# Patient Record
Sex: Male | Born: 1950 | Race: White | Hispanic: No | Smoking: Former smoker
Health system: Southern US, Community
[De-identification: ages and names within clinical notes are randomized; demographics above are authoritative.]

## PROBLEM LIST (undated history)

## (undated) DIAGNOSIS — C801 Malignant (primary) neoplasm, unspecified: Secondary | ICD-10-CM

## (undated) DIAGNOSIS — E785 Hyperlipidemia, unspecified: Secondary | ICD-10-CM

## (undated) DIAGNOSIS — Z86010 Personal history of colonic polyps: Secondary | ICD-10-CM

## (undated) HISTORY — DX: Hyperlipidemia, unspecified: E78.5

## (undated) HISTORY — PX: TONSILLECTOMY: SUR1361

## (undated) HISTORY — PX: COLONOSCOPY: SHX174

## (undated) HISTORY — DX: Personal history of colonic polyps: Z86.010

---
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
## 1983-12-04 DIAGNOSIS — C801 Malignant (primary) neoplasm, unspecified: Secondary | ICD-10-CM

## 1983-12-04 HISTORY — DX: Malignant (primary) neoplasm, unspecified: C80.1

## 2006-07-25 ENCOUNTER — Ambulatory Visit: Payer: Self-pay | Admitting: General Surgery

## 2009-01-19 ENCOUNTER — Emergency Department: Payer: Self-pay | Admitting: Unknown Physician Specialty

## 2009-01-19 ENCOUNTER — Ambulatory Visit: Payer: Self-pay | Admitting: Family Medicine

## 2010-12-03 DIAGNOSIS — Z860100 Personal history of colon polyps, unspecified: Secondary | ICD-10-CM

## 2010-12-03 HISTORY — DX: Personal history of colonic polyps: Z86.010

## 2010-12-03 HISTORY — DX: Personal history of colon polyps, unspecified: Z86.0100

## 2011-10-05 ENCOUNTER — Ambulatory Visit: Payer: Self-pay | Admitting: General Surgery

## 2013-06-17 ENCOUNTER — Encounter: Payer: Self-pay | Admitting: *Deleted

## 2016-08-30 ENCOUNTER — Encounter: Payer: Self-pay | Admitting: *Deleted

## 2016-10-08 ENCOUNTER — Ambulatory Visit: Payer: Self-pay | Admitting: General Surgery

## 2017-05-16 ENCOUNTER — Encounter: Payer: Self-pay | Admitting: *Deleted

## 2017-05-22 ENCOUNTER — Encounter: Payer: Self-pay | Admitting: General Surgery

## 2017-05-22 ENCOUNTER — Ambulatory Visit: Payer: Self-pay | Admitting: General Surgery

## 2017-05-22 ENCOUNTER — Ambulatory Visit (INDEPENDENT_AMBULATORY_CARE_PROVIDER_SITE_OTHER): Payer: BLUE CROSS/BLUE SHIELD | Admitting: General Surgery

## 2017-05-22 VITALS — BP 130/80 | HR 74 | Resp 12 | Ht 69.0 in | Wt 223.0 lb

## 2017-05-22 DIAGNOSIS — Z86010 Personal history of colonic polyps: Secondary | ICD-10-CM

## 2017-05-22 MED ORDER — POLYETHYLENE GLYCOL 3350 17 GM/SCOOP PO POWD: ORAL | 0 refills | Status: DC

## 2017-05-22 NOTE — Progress Notes (Signed)
Patient ID: Eduardo Chapman. [name]., male   DOB: 10-27-1951, 66 y.o.   MRN: [id number]  Chief Complaint  Patient presents with  . Colonoscopy    HPI Eduardo Chapman. Eduardo Chapman. is a 66 y.o. male  Here today for a evaluation of a colonoscopy. Last colonoscopy was done on 10/05/2011. No GI problems at this time.  HPI  Past Medical History:  Diagnosis Date  . Hyperlipidemia   . Personal history of colonic polyps 2012    Past Surgical History:  Procedure Laterality Date  . COLONOSCOPY  [id number]   path report 2012, benign    No family history on file.  Social History Social History  Substance Use Topics  . Smoking status: Never Smoker  . Smokeless tobacco: Never Used  . Alcohol use Yes     Comment: ocasionally    No Known Allergies  Current Outpatient Prescriptions  Medication Sig Dispense Refill  . diphenhydrAMINE (BENADRYL) 50 MG tablet Take 50 mg by mouth at bedtime as needed for itching.    . Multiple Vitamins-Minerals (MULTIVITAMIN WITH MINERALS) tablet Take 1 tablet by mouth daily.    . rosuvastatin (CRESTOR) 5 MG tablet Take 5 mg by mouth daily.    . polyethylene glycol powder (GLYCOLAX/MIRALAX) powder 255 grams one bottle for colonoscopy prep 255 Chapman 0   No current facility-administered medications for this visit.     Review of Systems Review of Systems  Constitutional: Negative.   Respiratory: Negative.   Cardiovascular: Negative.   Gastrointestinal: Negative.     Blood pressure 130/80, pulse 74, resp. rate 12, height 5\' 9"  (1.753 m), weight 223 lb (101.2 kg).  Physical Exam Physical Exam  Constitutional: He is oriented to person, place, and time. He appears well-developed and well-nourished.  Eyes: Conjunctivae are normal. No scleral icterus.  Neck: Neck supple.  Cardiovascular: Normal rate, regular rhythm and normal heart sounds.   Pulmonary/Chest: Effort normal and breath sounds normal.  Abdominal: Soft. Normal appearance and bowel sounds are normal. There is  no hepatomegaly. No hernia.  Lymphadenopathy:    He has no cervical adenopathy.  Neurological: He is alert and oriented to person, place, and time.  Skin: Skin is warm and dry.  Psychiatric: He has a normal mood and affect. His behavior is normal.    Data Reviewed Prior notes and colonoscopy reviewed.  Assessment    History of colonic polyp- removed over 10 years ago. Pt is asymptomatic, no complaints at this time. Stable exam otherwise.    Plan    Colonoscopy with possible biopsy/polypectomy prn: Information regarding the procedure, including its potential risks and complications (including but not limited to perforation of the bowel, which may require emergency surgery to repair, and bleeding) was verbally given to the patient. Educational information regarding lower intestinal endoscopy was given to the patient. Written instructions for how to complete the bowel prep using Miralax were provided. The importance of drinking ample fluids to avoid dehydration as a result of the prep emphasized.     HPI, Physical Exam, Assessment and Plan have been scribed under the direction and in the presence of Mckinley Jewel, MD  Eduardo Chapman, CMA  I have completed the exam and reviewed the above documentation for accuracy and completeness.  I agree with the above.  Haematologist has been used and any errors in dictation or transcription are unintentional.  Eduardo Chapman. Jamal Collin, M.D., F.A.C.S.  Eduardo Chapman 05/22/2017, 12:57 PM  Patient has been scheduled for a colonoscopy on 06-12-17  at [facility]. Miralax prescription has been sent in to the patient's pharmacy today. Colonoscopy instructions have been reviewed with the patient. This patient is aware to call the office if they have further questions.   Dominga Ferry, CMA

## 2017-05-22 NOTE — Patient Instructions (Signed)
Colonoscopy, Adult A colonoscopy is an exam to look at the entire large intestine. During the exam, a lubricated, bendable tube is inserted into the anus and then passed into the rectum, colon, and other parts of the large intestine. A colonoscopy is often done as a part of normal colorectal screening or in response to certain symptoms, such as anemia, persistent diarrhea, abdominal pain, and blood in the stool. The exam can help screen for and diagnose medical problems, including:  Tumors.  Polyps.  Inflammation.  Areas of bleeding.  Tell a health care provider about:  Any allergies you have.  All medicines you are taking, including vitamins, herbs, eye drops, creams, and over-the-counter medicines.  Any problems you or family members have had with anesthetic medicines.  Any blood disorders you have.  Any surgeries you have had.  Any medical conditions you have.  Any problems you have had passing stool. What are the risks? Generally, this is a safe procedure. However, problems may occur, including:  Bleeding.  A tear in the intestine.  A reaction to medicines given during the exam.  Infection (rare).  What happens before the procedure? Eating and drinking restrictions Follow instructions from your health care provider about eating and drinking, which may include:  A few days before the procedure - follow a low-fiber diet. Avoid nuts, seeds, dried fruit, raw fruits, and vegetables.  1-3 days before the procedure - follow a clear liquid diet. Drink only clear liquids, such as clear broth or bouillon, black coffee or tea, clear juice, clear soft drinks or sports drinks, gelatin dessert, and popsicles. Avoid any liquids that contain red or purple dye.  On the day of the procedure - do not eat or drink anything during the 2 hours before the procedure, or within the time period that your health care provider recommends.  Bowel prep If you were prescribed an oral bowel prep  to clean out your colon:  Take it as told by your health care provider. Starting the day before your procedure, you will need to drink a large amount of medicated liquid. The liquid will cause you to have multiple loose stools until your stool is almost clear or light green.  If your skin or anus gets irritated from diarrhea, you may use these to relieve the irritation: ? Medicated wipes, such as adult wet wipes with aloe and vitamin E. ? A skin soothing-product like petroleum jelly.  If you vomit while drinking the bowel prep, take a break for up to 60 minutes and then begin the bowel prep again. If vomiting continues and you cannot take the bowel prep without vomiting, call your health care provider.  General instructions  Ask your health care provider about changing or stopping your regular medicines. This is especially important if you are taking diabetes medicines or blood thinners.  Plan to have someone take you home from the hospital or clinic. What happens during the procedure?  An IV tube may be inserted into one of your veins.  You will be given medicine to help you relax (sedative).  To reduce your risk of infection: ? Your health care team will wash or sanitize their hands. ? Your anal area will be washed with soap.  You will be asked to lie on your side with your knees bent.  Your health care provider will lubricate a long, thin, flexible tube. The tube will have a camera and a light on the end.  The tube will be inserted into your   anus.  The tube will be gently eased through your rectum and colon.  Air will be delivered into your colon to keep it open. You may feel some pressure or cramping.  The camera will be used to take images during the procedure.  A small tissue sample may be removed from your body to be examined under a microscope (biopsy). If any potential problems are found, the tissue will be sent to a lab for testing.  If small polyps are found, your  health care provider may remove them and have them checked for cancer cells.  The tube that was inserted into your anus will be slowly removed. The procedure may vary among health care providers and hospitals. What happens after the procedure?  Your blood pressure, heart rate, breathing rate, and blood oxygen level will be monitored until the medicines you were given have worn off.  Do not drive for 24 hours after the exam.  You may have a small amount of blood in your stool.  You may pass gas and have mild abdominal cramping or bloating due to the air that was used to inflate your colon during the exam.  It is up to you to get the results of your procedure. Ask your health care provider, or the department performing the procedure, when your results will be ready. This information is not intended to replace advice given to you by your health care provider. Make sure you discuss any questions you have with your health care provider. Document Released: 11/16/2000 Document Revised: 09/19/2016 Document Reviewed: 01/31/2016 Elsevier Interactive Patient Education  2018 Elsevier Inc.  

## 2017-06-11 ENCOUNTER — Encounter: Payer: Self-pay | Admitting: *Deleted

## 2017-06-12 ENCOUNTER — Ambulatory Visit
Admission: RE | Admit: 2017-06-12 | Discharge: 2017-06-12 | Disposition: A | Discharge status: Home / Self Care | Payer: BLUE CROSS/BLUE SHIELD | Source: Ambulatory Visit | Attending: General Surgery | Admitting: General Surgery

## 2017-06-12 ENCOUNTER — Ambulatory Visit: Payer: BLUE CROSS/BLUE SHIELD | Admitting: Anesthesiology

## 2017-06-12 ENCOUNTER — Encounter
Admission: RE | Disposition: A | Discharge status: Home / Self Care | Payer: Self-pay | Source: Ambulatory Visit | Attending: General Surgery

## 2017-06-12 DIAGNOSIS — K648 Other hemorrhoids: Secondary | ICD-10-CM | POA: Insufficient documentation

## 2017-06-12 DIAGNOSIS — Z86010 Personal history of colonic polyps: Secondary | ICD-10-CM | POA: Insufficient documentation

## 2017-06-12 DIAGNOSIS — K640 First degree hemorrhoids: Secondary | ICD-10-CM | POA: Diagnosis not present

## 2017-06-12 DIAGNOSIS — D127 Benign neoplasm of rectosigmoid junction: Secondary | ICD-10-CM | POA: Diagnosis not present

## 2017-06-12 DIAGNOSIS — E785 Hyperlipidemia, unspecified: Secondary | ICD-10-CM | POA: Diagnosis not present

## 2017-06-12 DIAGNOSIS — D123 Benign neoplasm of transverse colon: Secondary | ICD-10-CM | POA: Diagnosis not present

## 2017-06-12 DIAGNOSIS — K621 Rectal polyp: Secondary | ICD-10-CM | POA: Insufficient documentation

## 2017-06-12 DIAGNOSIS — Z1211 Encounter for screening for malignant neoplasm of colon: Secondary | ICD-10-CM | POA: Diagnosis present

## 2017-06-12 HISTORY — PX: COLONOSCOPY WITH PROPOFOL: SHX5780

## 2017-06-12 SURGERY — COLONOSCOPY WITH PROPOFOL
Anesthesia: General

## 2017-06-12 MED ORDER — PROPOFOL 500 MG/50ML IV EMUL
INTRAVENOUS | Status: AC
  Filled 2017-06-12: qty 50

## 2017-06-12 MED ORDER — MIDAZOLAM HCL 2 MG/2ML IJ SOLN
INTRAMUSCULAR | Status: DC | PRN
  Administered 2017-06-12: 2 mg via INTRAVENOUS

## 2017-06-12 MED ORDER — PROPOFOL 500 MG/50ML IV EMUL
INTRAVENOUS | Status: DC | PRN
  Administered 2017-06-12: 120 ug/kg/min via INTRAVENOUS

## 2017-06-12 MED ORDER — FENTANYL CITRATE (PF) 100 MCG/2ML IJ SOLN
INTRAMUSCULAR | Status: AC
Start: 2017-06-12 — End: ?
  Filled 2017-06-12: qty 2

## 2017-06-12 MED ORDER — EPHEDRINE SULFATE 50 MG/ML IJ SOLN
INTRAMUSCULAR | Status: AC
  Filled 2017-06-12: qty 1

## 2017-06-12 MED ORDER — SODIUM CHLORIDE 0.9 % IV SOLN
INTRAVENOUS | Status: DC
  Administered 2017-06-12: 08:00:00 via INTRAVENOUS

## 2017-06-12 MED ORDER — FENTANYL CITRATE (PF) 100 MCG/2ML IJ SOLN
INTRAMUSCULAR | Status: DC | PRN
  Administered 2017-06-12 (×2): 50 ug via INTRAVENOUS

## 2017-06-12 MED ORDER — MIDAZOLAM HCL 2 MG/2ML IJ SOLN
INTRAMUSCULAR | Status: AC
  Filled 2017-06-12: qty 2

## 2017-06-12 MED ORDER — EPHEDRINE SULFATE 50 MG/ML IJ SOLN
INTRAMUSCULAR | Status: DC | PRN
  Administered 2017-06-12: 10 mg via INTRAVENOUS
  Administered 2017-06-12: 5 mg via INTRAVENOUS
  Administered 2017-06-12: 10 mg via INTRAVENOUS

## 2017-06-12 NOTE — Anesthesia Preprocedure Evaluation (Signed)
Anesthesia Evaluation  Patient identified by MRN, date of birth, ID band Patient awake    Reviewed: Allergy & Precautions, H&P , NPO status , Patient's Chart, lab work & pertinent test results  History of Anesthesia Complications Negative for: history of anesthetic complications  Airway Mallampati: III  TM Distance: >3 FB Neck ROM: limited    Dental  (+) Poor Dentition, Chipped, Caps   Pulmonary neg pulmonary ROS, neg shortness of breath,           Cardiovascular Exercise Tolerance: Good (-) angina(-) Past MI and (-) DOE negative cardio ROS       Neuro/Psych negative neurological ROS  negative psych ROS   GI/Hepatic negative GI ROS, Neg liver ROS, neg GERD  ,  Endo/Other  negative endocrine ROS  Renal/GU negative Renal ROS  negative genitourinary   Musculoskeletal   Abdominal   Peds  Hematology negative hematology ROS (+)   Anesthesia Other Findings Past Medical History: No date: Hyperlipidemia 2012: Personal history of colonic polyps  Past Surgical History: 2002,2012: COLONOSCOPY     Comment: path report 2012, benign  BMI    Body Mass Index:  31.01 kg/m      Reproductive/Obstetrics negative OB ROS                             Anesthesia Physical Anesthesia Plan  ASA: II  Anesthesia Plan: General   Post-op Pain Management:    Induction: Intravenous  PONV Risk Score and Plan:   Airway Management Planned: Natural Airway and Nasal Cannula  Additional Equipment:   Intra-op Plan:   Post-operative Plan:   Informed Consent: I have reviewed the patients History and Physical, chart, labs and discussed the procedure including the risks, benefits and alternatives for the proposed anesthesia with the patient or authorized representative who has indicated his/her understanding and acceptance.   Dental Advisory Given  Plan Discussed with: Anesthesiologist, CRNA and  Surgeon  Anesthesia Plan Comments: (Patient consented for risks of anesthesia including but not limited to:  - adverse reactions to medications - damage to teeth, lips or other oral mucosa - sore throat or hoarseness - Damage to heart, brain, lungs or loss of life  Patient voiced understanding.)        Anesthesia Quick Evaluation

## 2017-06-12 NOTE — Op Note (Signed)
[facility]astroenterology Patient Name: Taivon Haroon Procedure Date: 06/12/2017 8:49 AM MRN: [id number] Account #: [id number] Date of Birth: 1951-06-08 Admit Type: Outpatient Age: 66 Room: [facility] ENDO ROOM 1 Gender: Male Note Status: Finalized Procedure:            Colonoscopy Indications:          High risk colon cancer surveillance: Personal history                        of colonic polyps Providers:            Kiano Terrien G. Jamal Collin, MD Referring MD:         Leona Carry. Hall Busing, MD (Referring MD) Medicines:            Total IV Anesthesia (TIVA) Complications:        No immediate complications. Procedure:            Pre-Anesthesia Assessment:                       - Using IV propofol under the supervision of an                        anesthesiologist was determined to be medically                        necessary for this procedure based on review of the                        patient's medical history, medications, and prior                        anesthesia history.                       After obtaining informed consent, the colonoscope was                        passed under direct vision. Throughout the procedure,                        the patient's blood pressure, pulse, and oxygen                        saturations were monitored continuously. The                        Colonoscope was introduced through the anus and                        advanced to the the cecum, identified by the ileocecal                        valve. The colonoscopy was performed without                        difficulty. The patient tolerated the procedure well.                        The quality of the bowel preparation was good. Findings:      The perianal and digital rectal examinations were normal.  A 5 mm polyp was found in the recto-sigmoid colon. The polyp was       sessile. The polyp was removed with a cold snare. Resection and       retrieval were complete.      Two  sessile polyps were found in the rectum. The polyps were 3 mm in       size. These polyps were removed with a cold snare. Resection and       retrieval were complete.      Three sessile polyps were found in the rectum (benign-appearing lesion).       The polyps were 2 mm in size. These polyps were removed with a cold       biopsy forceps. Resection and retrieval were complete.      Internal hemorrhoids were found during retroflexion. The hemorrhoids       were mild and Grade I (internal hemorrhoids that do not prolapse).      The exam was otherwise without abnormality on direct and retroflexion       views. Impression:           - One 5 mm polyp at the recto-sigmoid colon, removed                        with a cold snare. Resected and retrieved.                       - Two 3 mm polyps in the rectum, removed with a cold                        snare. Resected and retrieved.                       - Three benign appearing 2 mm polyps in the rectum,                        removed with a cold biopsy forceps. Resected and                        retrieved.                       - Internal hemorrhoids.                       - The examination was otherwise normal on direct and                        retroflexion views. Recommendation:       - Discharge patient to home.                       - Resume previous diet.                       - Continue present medications.                       - Await pathology results.                       - Repeat colonoscopy in 5 years for surveillance. Procedure Code(s):    --- Professional ---  45385, Colonoscopy, flexible; with removal of tumor(s),                        polyp(s), or other lesion(s) by snare technique                       45380, 59, Colonoscopy, flexible; with biopsy, single                        or multiple Diagnosis Code(s):    --- Professional ---                       Z86.010, Personal history of colonic polyps                        D12.7, Benign neoplasm of rectosigmoid junction                       K62.1, Rectal polyp                       K64.0, First degree hemorrhoids CPT copyright 2016 American Medical Association. All rights reserved. The codes documented in this report are preliminary and upon coder review may  be revised to meet current compliance requirements. Christene Lye, MD 06/12/2017 9:52:10 AM This report has been signed electronically. Number of Addenda: 0 Note Initiated On: 06/12/2017 8:49 AM Scope Withdrawal Time: 0 hours 17 minutes 58 seconds  Total Procedure Duration: 0 hours 42 minutes 9 seconds       [facility]

## 2017-06-12 NOTE — Anesthesia Procedure Notes (Signed)
Performed by: Vaughan Sine Pre-anesthesia Checklist: Patient identified, Emergency Drugs available, Suction available, Patient being monitored and Timeout performed Patient Re-evaluated:Patient Re-evaluated prior to inductionOxygen Delivery Method: Nasal cannula Preoxygenation: Pre-oxygenation with 100% oxygen Intubation Type: IV induction Airway Equipment and Method: Oral airway Placement Confirmation: positive ETCO2 and CO2 detector

## 2017-06-12 NOTE — Transfer of Care (Signed)
Immediate Anesthesia Transfer of Care Note  Patient: Eduardo Schreier. Fearnow Sr.  Procedure(s) Performed: Procedure(s): COLONOSCOPY WITH PROPOFOL (N/A)  Patient Location: PACU  Anesthesia Type:General  Level of Consciousness: awake and sedated  Airway & Oxygen Therapy: Patient Spontanous Breathing  Post-op Assessment: Report given to RN and Post -op Vital signs reviewed and stable  Post vital signs: Reviewed and stable  Last Vitals:  Vitals:   06/12/17 0812  BP: 111/82  Pulse: 86  Resp: 18  Temp: (!) 36.2 C    Last Pain:  Vitals:   06/12/17 0812  TempSrc: Tympanic         Complications: No apparent anesthesia complications

## 2017-06-12 NOTE — H&P (View-Only) (Signed)
Patient ID: Eduardo Chapman. Eduardo Chapman., male   DOB: 02-03-1951, 66 y.o.   MRN: [id number]  Chief Complaint  Patient presents with  . Colonoscopy    HPI Eduardo Chapman. Eduardo Chapman. is a 66 y.o. male  Here today for a evaluation of a colonoscopy. Last colonoscopy was done on 10/05/2011. No GI problems at this time.  HPI  Past Medical History:  Diagnosis Date  . Hyperlipidemia   . Personal history of colonic polyps 2012    Past Surgical History:  Procedure Laterality Date  . COLONOSCOPY  [id number]   path report 2012, benign    No family history on file.  Social History Social History  Substance Use Topics  . Smoking status: Never Smoker  . Smokeless tobacco: Never Used  . Alcohol use Yes     Comment: ocasionally    No Known Allergies  Current Outpatient Prescriptions  Medication Sig Dispense Refill  . diphenhydrAMINE (BENADRYL) 50 MG tablet Take 50 mg by mouth at bedtime as needed for itching.    . Multiple Vitamins-Minerals (MULTIVITAMIN WITH MINERALS) tablet Take 1 tablet by mouth daily.    . rosuvastatin (CRESTOR) 5 MG tablet Take 5 mg by mouth daily.    . polyethylene glycol powder (GLYCOLAX/MIRALAX) powder 255 grams one bottle for colonoscopy prep 255 g 0   No current facility-administered medications for this visit.     Review of Systems Review of Systems  Constitutional: Negative.   Respiratory: Negative.   Cardiovascular: Negative.   Gastrointestinal: Negative.     Blood pressure 130/80, pulse 74, resp. rate 12, height 5\' 9"  (1.753 m), weight 223 lb (101.2 kg).  Physical Exam Physical Exam  Constitutional: He is oriented to person, place, and time. He appears well-developed and well-nourished.  Eyes: Conjunctivae are normal. No scleral icterus.  Neck: Neck supple.  Cardiovascular: Normal rate, regular rhythm and normal heart sounds.   Pulmonary/Chest: Effort normal and breath sounds normal.  Abdominal: Soft. Normal appearance and bowel sounds are normal. There is  no hepatomegaly. No hernia.  Lymphadenopathy:    He has no cervical adenopathy.  Neurological: He is alert and oriented to person, place, and time.  Skin: Skin is warm and dry.  Psychiatric: He has a normal mood and affect. His behavior is normal.    Data Reviewed Prior notes and colonoscopy reviewed.  Assessment    History of colonic polyp- removed over 10 years ago. Pt is asymptomatic, no complaints at this time. Stable exam otherwise.    Plan    Colonoscopy with possible biopsy/polypectomy prn: Information regarding the procedure, including its potential risks and complications (including but not limited to perforation of the bowel, which may require emergency surgery to repair, and bleeding) was verbally given to the patient. Educational information regarding lower intestinal endoscopy was given to the patient. Written instructions for how to complete the bowel prep using Miralax were provided. The importance of drinking ample fluids to avoid dehydration as a result of the prep emphasized.     HPI, Physical Exam, Assessment and Plan have been scribed under the direction and in the presence of Mckinley Jewel, MD  Gaspar Cola, CMA  I have completed the exam and reviewed the above documentation for accuracy and completeness.  I agree with the above.  Haematologist has been used and any errors in dictation or transcription are unintentional.  Seeplaputhur G. Jamal Collin, M.D., F.A.C.S.  Junie Panning G 05/22/2017, 12:57 PM  Patient has been scheduled for a colonoscopy on 06-12-17  at [facility]. Miralax prescription has been sent in to the patient's pharmacy today. Colonoscopy instructions have been reviewed with the patient. This patient is aware to call the office if they have further questions.   Dominga Ferry, CMA

## 2017-06-12 NOTE — Interval H&P Note (Signed)
History and Physical Interval Note:  06/12/2017 8:44 AM  Eduardo Reas. Nistler Sr.  has presented today for surgery, with the diagnosis of HX COLON POLYP  The various methods of treatment have been discussed with the patient and family. After consideration of risks, benefits and other options for treatment, the patient has consented to  Procedure(s): COLONOSCOPY WITH PROPOFOL (N/A) as a surgical intervention .  The patient's history has been reviewed, patient examined, no change in status, stable for surgery.  I have reviewed the patient's chart and labs.  Questions were answered to the patient's satisfaction.     [name]

## 2017-06-12 NOTE — Anesthesia Post-op Follow-up Note (Cosign Needed)
Anesthesia QCDR form completed.        

## 2017-06-12 NOTE — Anesthesia Postprocedure Evaluation (Signed)
Anesthesia Post Note  Patient: Eduardo Haris. Giangrande Sr.  Procedure(s) Performed: Procedure(s) (LRB): COLONOSCOPY WITH PROPOFOL (N/A)  Patient location during evaluation: Endoscopy Anesthesia Type: General Level of consciousness: awake and alert Pain management: pain level controlled Vital Signs Assessment: post-procedure vital signs reviewed and stable Respiratory status: spontaneous breathing, nonlabored ventilation, respiratory function stable and patient connected to nasal cannula oxygen Cardiovascular status: blood pressure returned to baseline and stable Postop Assessment: no signs of nausea or vomiting Anesthetic complications: no     Last Vitals:  Vitals:   06/12/17 0812 06/12/17 0950  BP: 111/82 98/64  Pulse: 86 (!) 108  Resp: 18 15  Temp: (!) 36.2 C 36.4 C    Last Pain:  Vitals:   06/12/17 0950  TempSrc: Axillary                 Precious Haws Piscitello

## 2017-06-13 ENCOUNTER — Encounter: Payer: Self-pay | Admitting: General Surgery

## 2017-06-13 LAB — SURGICAL PATHOLOGY

## 2017-06-14 ENCOUNTER — Telehealth: Payer: Self-pay

## 2017-06-14 NOTE — Telephone Encounter (Signed)
Message left for patient to call for results. 

## 2017-06-14 NOTE — Telephone Encounter (Signed)
-----   Message from Christene Lye, MD sent at 06/14/2017  9:42 AM EDT ----- Please inform pt- polyps were benign

## 2017-06-17 NOTE — Telephone Encounter (Signed)
Notified patient as instructed, patient pleased. Discussed follow-up appointments, patient agrees  

## 2017-07-18 ENCOUNTER — Encounter: Payer: Self-pay | Admitting: General Surgery

## 2017-07-18 ENCOUNTER — Encounter: Payer: Self-pay | Admitting: *Deleted

## 2017-09-12 ENCOUNTER — Encounter: Payer: Self-pay | Admitting: General Surgery

## 2019-01-26 ENCOUNTER — Other Ambulatory Visit: Payer: Self-pay | Admitting: Physician Assistant

## 2019-01-26 DIAGNOSIS — M5416 Radiculopathy, lumbar region: Secondary | ICD-10-CM

## 2019-01-26 DIAGNOSIS — M5441 Lumbago with sciatica, right side: Secondary | ICD-10-CM

## 2019-02-06 ENCOUNTER — Ambulatory Visit
Admission: RE | Admit: 2019-02-06 | Discharge: 2019-02-06 | Disposition: A | Discharge status: Home / Self Care | Payer: BLUE CROSS/BLUE SHIELD | Source: Ambulatory Visit | Attending: Physician Assistant | Admitting: Physician Assistant

## 2019-02-06 ENCOUNTER — Other Ambulatory Visit: Payer: Self-pay

## 2019-02-06 DIAGNOSIS — M5441 Lumbago with sciatica, right side: Secondary | ICD-10-CM | POA: Diagnosis not present

## 2019-02-06 DIAGNOSIS — M5416 Radiculopathy, lumbar region: Secondary | ICD-10-CM | POA: Diagnosis present

## 2019-05-29 ENCOUNTER — Other Ambulatory Visit: Payer: Self-pay | Admitting: Neurosurgery

## 2019-06-01 NOTE — Addendum Note (Signed)
Addended by: Deetta Perla on: 06/01/2019 12:23 PM   Modules accepted: Orders

## 2019-06-15 ENCOUNTER — Encounter
Admission: RE | Admit: 2019-06-15 | Discharge: 2019-06-15 | Disposition: A | Discharge status: Home / Self Care | Payer: BC Managed Care – PPO | Source: Ambulatory Visit | Attending: Neurosurgery | Admitting: Neurosurgery

## 2019-06-15 ENCOUNTER — Other Ambulatory Visit: Payer: Self-pay

## 2019-06-15 DIAGNOSIS — M5416 Radiculopathy, lumbar region: Secondary | ICD-10-CM | POA: Insufficient documentation

## 2019-06-15 DIAGNOSIS — Z01818 Encounter for other preprocedural examination: Secondary | ICD-10-CM | POA: Diagnosis not present

## 2019-06-15 DIAGNOSIS — Z1159 Encounter for screening for other viral diseases: Secondary | ICD-10-CM | POA: Diagnosis not present

## 2019-06-15 LAB — BASIC METABOLIC PANEL
Anion gap: 11 (ref 5–15)
BUN: 14 mg/dL (ref 8–23)
CO2: 23 mmol/L (ref 22–32)
Calcium: 9.5 mg/dL (ref 8.9–10.3)
Chloride: 105 mmol/L (ref 98–111)
Creatinine, Ser: 0.61 mg/dL (ref 0.61–1.24)
GFR calc Af Amer: >60 mL/min (ref >60)
GFR calc non Af Amer: >60 mL/min (ref >60)
Glucose, Bld: 116 mg/dL — ABNORMAL HIGH (ref 70–99)
Potassium: 3.8 mmol/L (ref 3.5–5.1)
Sodium: 139 mmol/L (ref 135–145)

## 2019-06-15 LAB — CBC
HCT: 41.7 % (ref 39.0–52.0)
Hemoglobin: 13.9 g/dL (ref 13.0–17.0)
MCH: 31 pg (ref 26.0–34.0)
MCHC: 33.3 g/dL (ref 30.0–36.0)
MCV: 93.1 fL (ref 80.0–100.0)
Platelets: 233 10*3/uL (ref 150–400)
RBC: 4.48 MIL/uL (ref 4.22–5.81)
RDW: 12.4 % (ref 11.5–15.5)
WBC: 5.3 10*3/uL (ref 4.0–10.5)
nRBC: 0 % (ref 0.0–0.2)

## 2019-06-15 LAB — URINALYSIS, ROUTINE W REFLEX MICROSCOPIC
Bilirubin Urine: NEGATIVE
Glucose, UA: NEGATIVE mg/dL
Hgb urine dipstick: NEGATIVE
Ketones, ur: NEGATIVE mg/dL
Leukocytes,Ua: NEGATIVE
Nitrite: NEGATIVE
Protein, ur: NEGATIVE mg/dL
Specific Gravity, Urine: 1.013 (ref 1.005–1.030)
pH: 5 (ref 5.0–8.0)

## 2019-06-15 LAB — SURGICAL PCR SCREEN
MRSA, PCR: NEGATIVE
Staphylococcus aureus: NEGATIVE

## 2019-06-15 LAB — PROTIME-INR
INR: 0.9 (ref 0.8–1.2)
Prothrombin Time: 11.9 seconds (ref 11.4–15.2)

## 2019-06-15 LAB — APTT: aPTT: 29 seconds (ref 24–36)

## 2019-06-15 NOTE — Patient Instructions (Signed)
INSTRUCTIONS FOR SURGERY     Your surgery is scheduled for:   Monday, July 20TH     To find out your arrival time for the day of surgery,          please call [phone] between 1 pm and 3 pm on :  Friday, July 17TH     When you arrive for surgery, report to the [facility].       Do NOT stop on the first floor to register.    REMEMBER: Instructions that are not followed completely may result in serious medical risk,  up to and including death, or upon the discretion of your surgeon and anesthesiologist,            your surgery may need to be rescheduled.  __X__ 1. Do not eat food after midnight the night before your procedure.                    No gum, candy, lozenger, tic tacs, tums or hard candies.                  ABSOLUTELY NOTHING SOLID IN YOUR MOUTH AFTER MIDNIGHT                    You may drink unlimited clear liquids up to 2 hours before you are scheduled to arrive for surgery.                   Do not drink anything within those 2 hours unless you need to take medicine, then take the                   smallest amount you need.  Clear liquids include:  water, apple juice without pulp,                   any flavor Gatorade, Black coffee, black tea.  Sugar may be added but no dairy/ honey /lemon.                        Broth and jello is not considered a clear liquid.  __x__  2. On the morning of surgery, please brush your teeth with toothpaste and water. You may rinse with                  mouthwash if you wish but DO NOT SWALLOW TOOTHPASTE OR MOUTHWASH  __X___3. NO alcohol for 24 hours before or after surgery.  __x___ 4.  Do NOT smoke or use e-cigarettes for 24 HOURS PRIOR TO SURGERY.                      DO NOT Use any chewable tobacco products for at least 6 hours prior to surgery.  __x___ 5. If you start any new medication after this appointment and prior to surgery, please  Bring it with you on the day of surgery.  ___x__ 6. Notify your doctor if there is any change in your medical condition, such as fever, infection, vomitting,  Diarrhea or any open sores.  __x___ 7.  USE the CHG SOAP as instructed, the night before surgery and the day of surgery.                   Once you have washed with this soap, do NOT use any of the following: Powders, perfumes                    or lotions. Please do not wear make up, hairpins, clips or nail polish. You MAY wear deodorant.                   Men may shave their face and neck.  Women need to shave 48 hours prior to surgery.                   DO NOT wear ANY jewelry on the day of surgery. If there are rings that are too tight to                    remove easily, please address this prior to the surgery day. Piercings need to be removed.                                                                     NO METAL ON YOUR BODY.                    Do NOT bring any valuables.  If you came to Pre-Admit testing then you will not need license,                     insurance card or credit card.  If you will be staying overnight, please either leave your things in                     the car or have your family be responsible for these items.                     [facility] IS NOT RESPONSIBLE FOR BELONGINGS OR VALUABLES.  ___X__ 8. DO NOT wear contact lenses on surgery day.  You may not have dentures,                     Hearing aides, contacts or glasses in the operating room. These items can be                    Placed in the Recovery Room to receive immediately after surgery.  __x___ 9. IF YOU ARE SCHEDULED TO GO HOME ON THE SAME DAY, YOU MUST                   Have someone to drive you home and to stay with you  for the first 24 hours.                    Have an arrangement prior to arriving on surgery day.  ___x__ 10. Take the following medications on the morning of surgery with a sip of water:  1. TYLENOL , IF NEEDED                     2.                     3.                  _____ 11.  Follow any instructions provided to you by your surgeon.                        Such as enema, clear liquid bowel prep  __X__  12. STOP COUMADIN / PLAVIX / ELIQUIS / ASPIRIN AS OF: TODAY                       THIS INCLUDES BC POWDERS / GOODIES POWDER  __x___ 13. STOP Anti-inflammatories as of: TODAY                      This includes IBUPROFEN / MOTRIN / ADVIL / ALEVE/ NAPROXYN                    YOU MAY TAKE TYLENOL ANY TIME PRIOR TO SURGERY.  _X____ 14.  Stop supplements until after surgery.                     This includes: MULTIVITAMINS                 You may continue taking Vitamin B12 / Vitamin D3 but do not take on the morning of surgery.  _____   15. Bring your CPAP machine into preop with you on the morning of surgery.  __X____16. If staying overnight, please have appropriate shoes to wear to be able to walk around the unit.                   Wear clean and comfortable clothing to the hospital.   IF YOU HAVE THE MEDICAL POWER OF ATTORNEY FORMS COMPLETED, PLEASE BRING WITH      YOU SO WE CAN COPY IT FOR YOUR FILES.  HAVE THE PHONE NUMBER OF YOUR SON TO CONTACT FOR PICKUP.  [place] PHONE AND CHARGER WITH YOU TO [place].

## 2019-06-18 ENCOUNTER — Other Ambulatory Visit
Admission: RE | Admit: 2019-06-18 | Discharge: 2019-06-18 | Disposition: A | Discharge status: Home / Self Care | Payer: BC Managed Care – PPO | Source: Ambulatory Visit | Attending: Neurosurgery | Admitting: Neurosurgery

## 2019-06-18 ENCOUNTER — Other Ambulatory Visit: Payer: Self-pay

## 2019-06-18 DIAGNOSIS — Z1159 Encounter for screening for other viral diseases: Secondary | ICD-10-CM | POA: Insufficient documentation

## 2019-06-18 LAB — SARS CORONAVIRUS 2 (TAT 6-24 HRS): SARS Coronavirus 2: NEGATIVE

## 2019-06-22 ENCOUNTER — Observation Stay
Admission: RE | Admit: 2019-06-22 | Discharge: 2019-06-23 | Disposition: A | Discharge status: Home / Self Care | Payer: BC Managed Care – PPO | Attending: Neurosurgery | Admitting: Neurosurgery

## 2019-06-22 ENCOUNTER — Ambulatory Visit: Payer: BC Managed Care – PPO | Admitting: Anesthesiology

## 2019-06-22 ENCOUNTER — Other Ambulatory Visit: Payer: Self-pay

## 2019-06-22 ENCOUNTER — Encounter
Admission: RE | Disposition: A | Discharge status: Home / Self Care | Payer: Self-pay | Source: Home / Self Care | Attending: Neurosurgery

## 2019-06-22 ENCOUNTER — Ambulatory Visit: Payer: BC Managed Care – PPO

## 2019-06-22 ENCOUNTER — Observation Stay: Payer: BC Managed Care – PPO

## 2019-06-22 DIAGNOSIS — Z87891 Personal history of nicotine dependence: Secondary | ICD-10-CM | POA: Insufficient documentation

## 2019-06-22 DIAGNOSIS — I10 Essential (primary) hypertension: Secondary | ICD-10-CM | POA: Diagnosis not present

## 2019-06-22 DIAGNOSIS — I700 Atherosclerosis of aorta: Secondary | ICD-10-CM | POA: Diagnosis not present

## 2019-06-22 DIAGNOSIS — M48061 Spinal stenosis, lumbar region without neurogenic claudication: Secondary | ICD-10-CM | POA: Diagnosis not present

## 2019-06-22 DIAGNOSIS — Z9889 Other specified postprocedural states: Secondary | ICD-10-CM

## 2019-06-22 DIAGNOSIS — M5116 Intervertebral disc disorders with radiculopathy, lumbar region: Secondary | ICD-10-CM | POA: Insufficient documentation

## 2019-06-22 DIAGNOSIS — Z419 Encounter for procedure for purposes other than remedying health state, unspecified: Secondary | ICD-10-CM

## 2019-06-22 HISTORY — PX: LUMBAR LAMINECTOMY/DECOMPRESSION MICRODISCECTOMY: SHX5026

## 2019-06-22 SURGERY — LUMBAR LAMINECTOMY/DECOMPRESSION MICRODISCECTOMY 1 LEVEL
Anesthesia: General

## 2019-06-22 MED ORDER — PROPOFOL 10 MG/ML IV BOLUS
INTRAVENOUS | Status: AC
  Filled 2019-06-22: qty 20

## 2019-06-22 MED ORDER — SUGAMMADEX SODIUM 500 MG/5ML IV SOLN
INTRAVENOUS | Status: AC
  Filled 2019-06-22: qty 5

## 2019-06-22 MED ORDER — KETAMINE HCL 50 MG/ML IJ SOLN
INTRAMUSCULAR | Status: DC | PRN
  Administered 2019-06-22: 50 mg via INTRAMUSCULAR

## 2019-06-22 MED ORDER — ROSUVASTATIN CALCIUM 10 MG PO TABS
5.0000 mg | ORAL_TABLET | Freq: Every day | ORAL | Status: DC
  Administered 2019-06-22 – 2019-06-23 (×2): 5 mg via ORAL
  Filled 2019-06-22 (×2): qty 1

## 2019-06-22 MED ORDER — SODIUM CHLORIDE FLUSH 0.9 % IV SOLN
INTRAVENOUS | Status: AC
  Filled 2019-06-22: qty 10

## 2019-06-22 MED ORDER — DEXMEDETOMIDINE HCL IN NACL 80 MCG/20ML IV SOLN
INTRAVENOUS | Status: AC
  Filled 2019-06-22: qty 20

## 2019-06-22 MED ORDER — POLYETHYLENE GLYCOL 3350 17 G PO PACK: 17.0000 g | PACK | Freq: Every day | ORAL | Status: DC | PRN

## 2019-06-22 MED ORDER — HYDROMORPHONE HCL 1 MG/ML IJ SOLN: 0.5000 mg | INTRAMUSCULAR | Status: DC | PRN

## 2019-06-22 MED ORDER — MAGNESIUM CITRATE PO SOLN: 1.0000 | Freq: Once | ORAL | Status: DC | PRN

## 2019-06-22 MED ORDER — FENTANYL CITRATE (PF) 100 MCG/2ML IJ SOLN
INTRAMUSCULAR | Status: AC
  Filled 2019-06-22: qty 2

## 2019-06-22 MED ORDER — SUGAMMADEX SODIUM 500 MG/5ML IV SOLN
INTRAVENOUS | Status: DC | PRN
  Administered 2019-06-22: 225 mg via INTRAVENOUS

## 2019-06-22 MED ORDER — KETAMINE HCL 50 MG/ML IJ SOLN
INTRAMUSCULAR | Status: AC
  Filled 2019-06-22: qty 10

## 2019-06-22 MED ORDER — SUCCINYLCHOLINE CHLORIDE 20 MG/ML IJ SOLN
INTRAMUSCULAR | Status: DC | PRN
  Administered 2019-06-22: 140 mg via INTRAVENOUS

## 2019-06-22 MED ORDER — MIDAZOLAM HCL 2 MG/2ML IJ SOLN
INTRAMUSCULAR | Status: AC
  Filled 2019-06-22: qty 2

## 2019-06-22 MED ORDER — FENTANYL CITRATE (PF) 100 MCG/2ML IJ SOLN
INTRAMUSCULAR | Status: DC | PRN
  Administered 2019-06-22: 50 ug via INTRAVENOUS
  Administered 2019-06-22: 100 ug via INTRAVENOUS
  Administered 2019-06-22 (×2): 50 ug via INTRAVENOUS
  Administered 2019-06-22: 100 ug via INTRAVENOUS

## 2019-06-22 MED ORDER — OXYCODONE HCL 5 MG PO TABS: 5.0000 mg | ORAL_TABLET | Freq: Once | ORAL | Status: DC | PRN

## 2019-06-22 MED ORDER — METHOCARBAMOL 1000 MG/10ML IJ SOLN
500.0000 mg | Freq: Four times a day (QID) | INTRAVENOUS | Status: DC | PRN
  Filled 2019-06-22: qty 5

## 2019-06-22 MED ORDER — SODIUM CHLORIDE 0.9% FLUSH
3.0000 mL | Freq: Two times a day (BID) | INTRAVENOUS | Status: DC
  Administered 2019-06-22: 3 mL via INTRAVENOUS

## 2019-06-22 MED ORDER — CEFAZOLIN SODIUM-DEXTROSE 2-4 GM/100ML-% IV SOLN
2.0000 g | Freq: Once | INTRAVENOUS | Status: AC
  Administered 2019-06-22: 08:00:00 2 g via INTRAVENOUS

## 2019-06-22 MED ORDER — ONDANSETRON HCL 4 MG/2ML IJ SOLN: 4.0000 mg | Freq: Four times a day (QID) | INTRAMUSCULAR | Status: DC | PRN

## 2019-06-22 MED ORDER — SODIUM CHLORIDE 0.9 % IV SOLN
INTRAVENOUS | Status: DC | PRN
  Administered 2019-06-22: 08:00:00 500 mL

## 2019-06-22 MED ORDER — FENTANYL CITRATE (PF) 100 MCG/2ML IJ SOLN: 25.0000 ug | INTRAMUSCULAR | Status: DC | PRN

## 2019-06-22 MED ORDER — PHENYLEPHRINE HCL (PRESSORS) 10 MG/ML IV SOLN
INTRAVENOUS | Status: DC | PRN
  Administered 2019-06-22: 200 ug via INTRAVENOUS
  Administered 2019-06-22 (×3): 100 ug via INTRAVENOUS

## 2019-06-22 MED ORDER — PROPOFOL 10 MG/ML IV BOLUS
INTRAVENOUS | Status: DC | PRN
  Administered 2019-06-22: 50 mg via INTRAVENOUS
  Administered 2019-06-22: 150 mg via INTRAVENOUS

## 2019-06-22 MED ORDER — BACITRACIN 50000 UNITS IM SOLR
INTRAMUSCULAR | Status: AC
  Filled 2019-06-22: qty 1

## 2019-06-22 MED ORDER — OXYCODONE HCL 5 MG PO TABS: 10.0000 mg | ORAL_TABLET | ORAL | Status: DC | PRN

## 2019-06-22 MED ORDER — ACETAMINOPHEN 325 MG PO TABS: 650.0000 mg | ORAL_TABLET | ORAL | Status: DC | PRN

## 2019-06-22 MED ORDER — SENNA 8.6 MG PO TABS
1.0000 | ORAL_TABLET | Freq: Two times a day (BID) | ORAL | Status: DC
  Administered 2019-06-22 (×2): 8.6 mg via ORAL
  Filled 2019-06-22 (×2): qty 1

## 2019-06-22 MED ORDER — SODIUM CHLORIDE 0.9 % IV BOLUS
500.0000 mL | Freq: Once | INTRAVENOUS | Status: AC
  Administered 2019-06-22: 500 mL via INTRAVENOUS

## 2019-06-22 MED ORDER — FAMOTIDINE 20 MG PO TABS
ORAL_TABLET | ORAL | Status: AC
  Administered 2019-06-22: 20 mg via ORAL
  Filled 2019-06-22: qty 1

## 2019-06-22 MED ORDER — EPHEDRINE SULFATE 50 MG/ML IJ SOLN
INTRAMUSCULAR | Status: DC | PRN
  Administered 2019-06-22 (×5): 10 mg via INTRAVENOUS

## 2019-06-22 MED ORDER — OXYCODONE HCL 5 MG/5ML PO SOLN: 5.0000 mg | Freq: Once | ORAL | Status: DC | PRN

## 2019-06-22 MED ORDER — SODIUM CHLORIDE 0.9 % IV SOLN: 250.0000 mL | INTRAVENOUS | Status: DC

## 2019-06-22 MED ORDER — THROMBIN 5000 UNITS EX SOLR
CUTANEOUS | Status: DC | PRN
  Administered 2019-06-22: 5000 [IU] via TOPICAL

## 2019-06-22 MED ORDER — PHENYLEPHRINE HCL (PRESSORS) 10 MG/ML IV SOLN
INTRAVENOUS | Status: AC
  Filled 2019-06-22: qty 1

## 2019-06-22 MED ORDER — ROCURONIUM BROMIDE 100 MG/10ML IV SOLN
INTRAVENOUS | Status: DC | PRN
  Administered 2019-06-22: 15 mg via INTRAVENOUS
  Administered 2019-06-22: 20 mg via INTRAVENOUS
  Administered 2019-06-22: 5 mg via INTRAVENOUS

## 2019-06-22 MED ORDER — CEFAZOLIN SODIUM-DEXTROSE 2-4 GM/100ML-% IV SOLN
INTRAVENOUS | Status: AC
  Filled 2019-06-22: qty 100

## 2019-06-22 MED ORDER — THROMBIN 5000 UNITS EX SOLR
CUTANEOUS | Status: AC
  Filled 2019-06-22: qty 5000

## 2019-06-22 MED ORDER — METHYLPREDNISOLONE ACETATE 40 MG/ML IJ SUSP
INTRAMUSCULAR | Status: DC | PRN
  Administered 2019-06-22: 40 mg

## 2019-06-22 MED ORDER — ACETAMINOPHEN 10 MG/ML IV SOLN
INTRAVENOUS | Status: AC
  Filled 2019-06-22: qty 100

## 2019-06-22 MED ORDER — METHOCARBAMOL 500 MG PO TABS: 500.0000 mg | ORAL_TABLET | Freq: Four times a day (QID) | ORAL | Status: DC | PRN

## 2019-06-22 MED ORDER — LIDOCAINE-EPINEPHRINE (PF) 1 %-1:200000 IJ SOLN
INTRAMUSCULAR | Status: AC
  Filled 2019-06-22: qty 30

## 2019-06-22 MED ORDER — ACETAMINOPHEN 650 MG RE SUPP: 650.0000 mg | RECTAL | Status: DC | PRN

## 2019-06-22 MED ORDER — OXYCODONE HCL 5 MG PO TABS: 5.0000 mg | ORAL_TABLET | ORAL | Status: DC | PRN

## 2019-06-22 MED ORDER — SODIUM CHLORIDE 0.9% FLUSH: 3.0000 mL | INTRAVENOUS | Status: DC | PRN

## 2019-06-22 MED ORDER — DEXAMETHASONE SODIUM PHOSPHATE 10 MG/ML IJ SOLN
INTRAMUSCULAR | Status: AC
  Filled 2019-06-22: qty 1

## 2019-06-22 MED ORDER — ACETAMINOPHEN 500 MG PO TABS
1000.0000 mg | ORAL_TABLET | Freq: Four times a day (QID) | ORAL | Status: AC
  Administered 2019-06-22 – 2019-06-23 (×4): 1000 mg via ORAL
  Filled 2019-06-22 (×4): qty 2

## 2019-06-22 MED ORDER — FAMOTIDINE 20 MG PO TABS
20.0000 mg | ORAL_TABLET | Freq: Once | ORAL | Status: AC
  Administered 2019-06-22: 07:00:00 20 mg via ORAL

## 2019-06-22 MED ORDER — LACTATED RINGERS IV SOLN
INTRAVENOUS | Status: DC
  Administered 2019-06-22: 07:00:00 via INTRAVENOUS

## 2019-06-22 MED ORDER — MENTHOL 3 MG MT LOZG: 1.0000 | LOZENGE | OROMUCOSAL | Status: DC | PRN

## 2019-06-22 MED ORDER — METHYLPREDNISOLONE ACETATE 40 MG/ML IJ SUSP
INTRAMUSCULAR | Status: AC
  Filled 2019-06-22: qty 1

## 2019-06-22 MED ORDER — ONDANSETRON HCL 4 MG/2ML IJ SOLN
INTRAMUSCULAR | Status: DC | PRN
  Administered 2019-06-22: 4 mg via INTRAVENOUS

## 2019-06-22 MED ORDER — SODIUM CHLORIDE 0.9 % IV SOLN
INTRAVENOUS | Status: DC
  Administered 2019-06-22: 21:00:00 via INTRAVENOUS

## 2019-06-22 MED ORDER — ONDANSETRON HCL 4 MG/2ML IJ SOLN
INTRAMUSCULAR | Status: AC
  Filled 2019-06-22: qty 2

## 2019-06-22 MED ORDER — LIDOCAINE-EPINEPHRINE (PF) 1 %-1:200000 IJ SOLN
INTRAMUSCULAR | Status: DC | PRN
  Administered 2019-06-22: 8 mL

## 2019-06-22 MED ORDER — PHENOL 1.4 % MT LIQD: 1.0000 | OROMUCOSAL | Status: DC | PRN

## 2019-06-22 MED ORDER — DEXMEDETOMIDINE HCL 200 MCG/2ML IV SOLN
INTRAVENOUS | Status: DC | PRN
  Administered 2019-06-22: 8 ug via INTRAVENOUS
  Administered 2019-06-22 (×2): 20 ug via INTRAVENOUS

## 2019-06-22 MED ORDER — BUPIVACAINE HCL (PF) 0.5 % IJ SOLN
INTRAMUSCULAR | Status: AC
  Filled 2019-06-22: qty 30

## 2019-06-22 MED ORDER — ACETAMINOPHEN 10 MG/ML IV SOLN
INTRAVENOUS | Status: DC | PRN
  Administered 2019-06-22: 1000 mg via INTRAVENOUS

## 2019-06-22 MED ORDER — SODIUM CHLORIDE 0.9 % IV SOLN
INTRAVENOUS | Status: DC | PRN
  Administered 2019-06-22: 09:00:00 15 ug/min via INTRAVENOUS

## 2019-06-22 MED ORDER — BISACODYL 5 MG PO TBEC: 5.0000 mg | DELAYED_RELEASE_TABLET | Freq: Every day | ORAL | Status: DC | PRN

## 2019-06-22 MED ORDER — ONDANSETRON HCL 4 MG PO TABS: 4.0000 mg | ORAL_TABLET | Freq: Four times a day (QID) | ORAL | Status: DC | PRN

## 2019-06-22 MED ORDER — SUCCINYLCHOLINE CHLORIDE 20 MG/ML IJ SOLN
INTRAMUSCULAR | Status: AC
  Filled 2019-06-22: qty 1

## 2019-06-22 MED ORDER — FENTANYL CITRATE (PF) 250 MCG/5ML IJ SOLN
INTRAMUSCULAR | Status: AC
  Filled 2019-06-22: qty 5

## 2019-06-22 MED ORDER — BUPIVACAINE HCL 0.5 % IJ SOLN
INTRAMUSCULAR | Status: DC | PRN
  Administered 2019-06-22: 10 mL

## 2019-06-22 MED ORDER — ROCURONIUM BROMIDE 50 MG/5ML IV SOLN
INTRAVENOUS | Status: AC
  Filled 2019-06-22: qty 1

## 2019-06-22 MED ORDER — EPHEDRINE SULFATE 50 MG/ML IJ SOLN
INTRAMUSCULAR | Status: AC
  Filled 2019-06-22: qty 1

## 2019-06-22 MED ORDER — MIDAZOLAM HCL 2 MG/2ML IJ SOLN
INTRAMUSCULAR | Status: DC | PRN
  Administered 2019-06-22: 2 mg via INTRAVENOUS

## 2019-06-22 MED ORDER — DEXAMETHASONE SODIUM PHOSPHATE 10 MG/ML IJ SOLN
INTRAMUSCULAR | Status: DC | PRN
  Administered 2019-06-22: 10 mg via INTRAVENOUS

## 2019-06-22 SURGICAL SUPPLY — 59 items
BLADE BOVIE TIP EXT 4 (BLADE) ×2 IMPLANT
BUR NEURO DRILL SOFT 3.0X3.8M (BURR) ×3 IMPLANT
CANISTER SUCT 1200ML W/VALVE (MISCELLANEOUS) ×6 IMPLANT
CHLORAPREP W/TINT 26 (MISCELLANEOUS) ×6 IMPLANT
COUNTER NEEDLE 20/40 LG (NEEDLE) ×3 IMPLANT
COVER LIGHT HANDLE STERIS (MISCELLANEOUS) ×6 IMPLANT
COVER WAND RF STERILE (DRAPES) ×3 IMPLANT
CUP MEDICINE 2OZ PLAST GRAD ST (MISCELLANEOUS) ×3 IMPLANT
DERMABOND ADVANCED (GAUZE/BANDAGES/DRESSINGS) ×2
DERMABOND ADVANCED .7 DNX12 (GAUZE/BANDAGES/DRESSINGS) ×1 IMPLANT
DRAPE C-ARM 42X72 X-RAY (DRAPES) ×6 IMPLANT
DRAPE LAPAROTOMY 100X77 ABD (DRAPES) ×3 IMPLANT
DRAPE MICROSCOPE SPINE 48X150 (DRAPES) ×2 IMPLANT
DRAPE SURG 17X11 SM STRL (DRAPES) ×3 IMPLANT
DURASEAL APPLICATOR TIP (TIP) IMPLANT
DURASEAL SPINE SEALANT 3ML (MISCELLANEOUS) IMPLANT
ELECT CAUTERY BLADE TIP 2.5 (TIP) ×3
ELECT EZSTD 165MM 6.5IN (MISCELLANEOUS) ×3
ELECT REM PT RETURN 9FT ADLT (ELECTROSURGICAL) ×3
ELECTRODE CAUTERY BLDE TIP 2.5 (TIP) ×1 IMPLANT
ELECTRODE EZSTD 165MM 6.5IN (MISCELLANEOUS) ×1 IMPLANT
ELECTRODE REM PT RTRN 9FT ADLT (ELECTROSURGICAL) ×1 IMPLANT
GAUZE SPONGE 4X4 12PLY STRL (GAUZE/BANDAGES/DRESSINGS) ×3 IMPLANT
GLOVE BIOGEL PI IND STRL 7.0 (GLOVE) ×1 IMPLANT
GLOVE BIOGEL PI INDICATOR 7.0 (GLOVE) ×2
GLOVE INDICATOR 8.0 STRL GRN (GLOVE) ×9 IMPLANT
GLOVE SURG SYN 7.0 (GLOVE) ×6 IMPLANT
GLOVE SURG SYN 7.0 PF PI (GLOVE) ×2 IMPLANT
GLOVE SURG SYN 8.0 (GLOVE) ×3 IMPLANT
GLOVE SURG SYN 8.0 PF PI (GLOVE) ×1 IMPLANT
GOWN STRL REUS W/ TWL XL LVL3 (GOWN DISPOSABLE) ×1 IMPLANT
GOWN STRL REUS W/TWL MED LVL3 (GOWN DISPOSABLE) ×3 IMPLANT
GOWN STRL REUS W/TWL XL LVL3 (GOWN DISPOSABLE) ×2
GRADUATE 1200CC STRL 31836 (MISCELLANEOUS) ×3 IMPLANT
KIT TURNOVER KIT A (KITS) ×3 IMPLANT
KIT WILSON FRAME (KITS) ×3 IMPLANT
KNIFE BAYONET SHORT DISCETOMY (MISCELLANEOUS) ×2 IMPLANT
MARKER SKIN DUAL TIP RULER LAB (MISCELLANEOUS) ×6 IMPLANT
NDL SAFETY ECLIPSE 18X1.5 (NEEDLE) ×1 IMPLANT
NEEDLE HYPO 18GX1.5 SHARP (NEEDLE) ×2
NEEDLE HYPO 22GX1.5 SAFETY (NEEDLE) ×3 IMPLANT
NS IRRIG 1000ML POUR BTL (IV SOLUTION) ×3 IMPLANT
PACK LAMINECTOMY NEURO (CUSTOM PROCEDURE TRAY) ×3 IMPLANT
PAD ARMBOARD 7.5X6 YLW CONV (MISCELLANEOUS) ×3 IMPLANT
SPOGE SURGIFLO 8M (HEMOSTASIS) ×2
SPONGE SURGIFLO 8M (HEMOSTASIS) ×1 IMPLANT
STAPLER SKIN PROX 35W (STAPLE) ×2 IMPLANT
SUT NURALON 4 0 TR CR/8 (SUTURE) IMPLANT
SUT POLYSORB 2-0 5X18 GS-10 (SUTURE) ×3 IMPLANT
SUT VIC AB 0 CT1 18XCR BRD 8 (SUTURE) ×1 IMPLANT
SUT VIC AB 0 CT1 8-18 (SUTURE) ×2
SYR 10ML LL (SYRINGE) ×6 IMPLANT
SYR 30ML LL (SYRINGE) ×3 IMPLANT
SYR 3ML LL SCALE MARK (SYRINGE) ×3 IMPLANT
TOWEL OR 17X26 4PK STRL BLUE (TOWEL DISPOSABLE) ×12 IMPLANT
TUBE MATRX SPINL 22MM 6CM DISP (INSTRUMENTS) ×2
TUBE METRX SPINAL 22X6 DISP (INSTRUMENTS) IMPLANT
TUBING CONNECTING 10 (TUBING) ×2 IMPLANT
TUBING CONNECTING 10' (TUBING) ×1

## 2019-06-22 NOTE — Op Note (Signed)
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 Lashomb to clinic on6/9 with ongoingrightleg pain preventing movement.Hehad failed conservative management of steroids,prescription medications, injections, and therapyand the symptoms were affecting his lifestyle. MRI revealed right and central L3/4stenosis compressingthetraversingnerve root from a disc herniation and overgrown facet.Therisks of surgery were explained to include hematoma, infection, damage to nerve roots, CSF leak, weakness, numbness, pain, need for future surgery including fusion, heart attack, and stroke.He elected to proceed with surgery for symptom relief.   Procedure The patient was brought to the OR after informed consent was obtained.He was given general anesthesia and intubated by the anesthesia service. Vascular access lines were placed.The patient was then placed prone on a Wilson frameensuring all pressure points were padded.Antibiotics were administered.A time-out was performed per protocol.   The patient was sterilely prepped and draped. Fluoroscopy confirmedL3/4interspaceandanincision was planned1.5cm off midlineon theright.The incision was instilled withlocal anesthetic with epinephrine. The skin was opened sharply and the dissection taken to the fascia. This was incised and initial dilator placedthe spinous processes and lamina of L3on therightout to the medial edge of the facet.  Serial dilatorswere inserted via fluoroscopy and the final50mm tube was placed at depth of6cm.  The microscope was brought into the field. The overlying muscle was removed from lamina and medial facet.Next, a matchstickdrill bit was used to remove theL3lamina centrallyand going laterallyon both sides.A medial facetectomy was performed on the right given the hypertrophy.To gain access to lateral recess, a considerable amount of bone had to be removed on the right.The underlying ligament was freed and removed with combination of rongeurs. The decompression was taken caudal to the superior border ofL4. The dura was seen to be full and intact.Once all ligament and soft tissue was removed, attention was turned to inspection of the nerve root. The nerve rootwas displacedposteriorlydue to adisc bulgeand thecal sacwas retractedmedially.  The disc space was coagulated and entered sharply. Amoderatedisc fragment was expressed and removed. Multiple other small pieces were removed using a blunt instrument to check the epidural space and out the foramen.The dura and nerve root was seen to be free at this point. The epidural space was inspected and the nerve root followed laterally withoutany signs of compression. The disc space was irrigated ensuring no free disc fragment.  Multiple rounds of irrigation were used. Hemostasis was obtained.Depomedrol was placed along the nerve root.The microscope was removed.  Themuscle andfasciawas then closed using 0and 2-0vicryl followed by thesubcutaneous and dermal layers with 2-0 vicryluntil the epidermis was well approximated. Local was placed in the fascia. The skin was closed withDermabond..  The patient was returned to supine position and extubated by the anesthesia service. The patient was then taken to the PACU for post-operative care wherehe was moving extremities symmetrically.   ESTIMATED BLOOD  LOSS: 50cc  SPECIMENS None  IMPLANT None   I performed the case in its entiretywith assistance of PA, Corrie Mckusick, [place]

## 2019-06-22 NOTE — Anesthesia Postprocedure Evaluation (Signed)
Anesthesia Post Note  Patient: Eduardo YURKOVICH Sr.  Procedure(s) Performed: L3-4 laminectomy & discectomy (N/A )  Patient location during evaluation: PACU Anesthesia Type: General Level of consciousness: awake and alert Pain management: pain level controlled Vital Signs Assessment: post-procedure vital signs reviewed and stable Respiratory status: spontaneous breathing, nonlabored ventilation, respiratory function stable and patient connected to nasal cannula oxygen Cardiovascular status: blood pressure returned to baseline and stable Postop Assessment: no apparent nausea or vomiting Anesthetic complications: no     Last Vitals:  Vitals:   06/22/19 1140 06/22/19 1209  BP: 105/63 93/65  Pulse: 90 94  Resp: 14   Temp:  36.5 C  SpO2: 96% 96%    Last Pain:  Vitals:   06/22/19 1209  TempSrc: Oral  PainSc:                  Precious Haws Demetries Coia

## 2019-06-22 NOTE — Anesthesia Procedure Notes (Signed)
Procedure Name: Intubation Date/Time: 06/22/2019 7:32 AM Performed by: Jonna Clark, CRNA Pre-anesthesia Checklist: Patient identified, Patient being monitored, Timeout performed, Emergency Drugs available and Suction available Patient Re-evaluated:Patient Re-evaluated prior to induction Oxygen Delivery Method: Circle system utilized Preoxygenation: Pre-oxygenation with 100% oxygen Induction Type: IV induction Ventilation: Mask ventilation without difficulty and Oral airway inserted - appropriate to patient size Laryngoscope Size: 4 and McGraph Grade View: Grade I Tube type: Oral Tube size: 7.5 mm Number of attempts: 1 Airway Equipment and Method: Stylet Placement Confirmation: ETT inserted through vocal cords under direct vision,  positive ETCO2 and breath sounds checked- equal and bilateral Secured at: 24 cm Tube secured with: Tape Dental Injury: Teeth and Oropharynx as per pre-operative assessment  Difficulty Due To: Difficulty was anticipated and Difficult Airway- due to anterior larynx Future Recommendations: Recommend- induction with short-acting agent, and alternative techniques readily available

## 2019-06-22 NOTE — H&P (Signed)
History & Physical   Date of Service: 06/22/19    Time: 6:43 AM  Chief Complaint: Right leg pain  History of Present Illness: Mr. Eduardo Chapman is here for evaluation of ongoing symptoms of pain in his right leg that he says has been there about a year. He does not remember any inciting event but he does do a lot of lifting and walking at work. He states the pain will travel to the lateral side of his leg and calf into his foot. He additionally will get some numbness in the back of his thighs when he is up walking. He denies any left leg symptoms. He denies any weakness. He has attempted physical therapy, chiropractor, manipulations, over-the-counter medication, and epidural steroid injections. He never got more than a couple days relief from the steroid injections. He had a MRI of his lumbar spine and is here for review.   Past Medical History:  Diagnosis Date  . GERD (gastroesophageal reflux disease)   . Hyperlipidemia   . Hypertension    Past Surgical History:  Procedure Laterality Date  . cyst removal from spine     "about 50 years ago"  . 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 KNEE ARTHROSCOPY Right 03/2012  . Pin put in right hand index finger     Family History  Problem Relation Age of Onset  . No Known Problems Mother   . No Known Problems Father    Social History   Social History  . Marital status: Single    Spouse name: N/A  . Number of children: N/A  . Years of education: N/A   Social History Main Topics  . Smoking status: Former Smoker    Quit date: 03/03/1955  . Smokeless tobacco: Never Used  . Alcohol use 0.0 oz/week  . Drug use: No  . Sexual activity: Not Asked   Other Topics Concern  . None   Social History Narrative  . None     No Known Allergies  Medications: Cannot display prior to admission medications because the patient has not been admitted in this contact.     Review of Systems:  General ROS: Negative Psychological ROS: Negative Ophthalmic ROS: Negative ENT ROS: Negative Hematological and Lymphatic ROS: Negative  Endocrine ROS: Negative Respiratory ROS: Negative Cardiovascular ROS: Negative Gastrointestinal ROS: Negative Genito-Urinary ROS: Negative Musculoskeletal ROS: Negative for back pain Neurological ROS: Positive for leg pain, numbness Dermatological ROS: Negative   Physical Exam: Temp:  [97.6 F (36.4 C)] 97.6 F (36.4 C) (07/20 0627) Pulse Rate:  [92] 92 (07/20 0627) Resp:  [18] 18 (07/20 0627) BP: (139)/(104) 139/104 (07/20 0627) SpO2:  [97 %] 97 % (07/20 0627) Weight:  [111.1 kg] 111.1 kg (07/20 0627) Temp (24hrs), Avg:97.6 F (36.4 C), Min:97.6 F (36.4 C), Max:97.6 F (36.4 C)  Weight: 62.2 kg (137 lb 3.2 oz) General appearance: Alert, cooperative, in no acute distress Head: Normocephalic, atraumatic Eyes: Normal, EOM intact Oropharynx: Moist without lesions Back: No tenderness to palpation of the midline and paramedian regions in the lumbar spine Ext: No edema in LE bilaterally, warm extremities SV:[id number] rate Pulm: normal effort, clear to auscultation   Neurologic exam:  Mental status: alertness: alert, affect: normal Speech: fluent and clear Motor:strength symmetric 5/5 in bilateral lower extremities in all motor groups Sensory: intact to light touch in bilateral lower extremities Reflexes: 2+ and symmetric bilaterally for patella and ankle Gait:  normal    Data:  MRI lumbar spine: There is a normal lordotic curvature. Alignment appears well-maintained. There is mild disc bulging noted in the lower lumbar region with minimal stenosis. There is a large disc herniation eccentric to the right at the L3-4 level which results in severe stenosis. There is also ligamentum flavum hypertrophy at this level.   Assessment & Plan:  Active Problems:   Right L4 radiculopathy, neurogenic claudication   1.  Plan for L3/4 decompression and discectomy  VTE Prophylaxis: SCDs  Code Status: Full Code  Discharge Planning: Home  Malen Gauze, MD 10/29/2016

## 2019-06-22 NOTE — Discharge Instructions (Signed)

## 2019-06-22 NOTE — Interval H&P Note (Signed)
History and Physical Interval Note:  06/22/2019 6:43 AM  Eduardo Green Sr.  has presented today for surgery, with the diagnosis of lumbar radiculopathy m54.16.  The various methods of treatment have been discussed with the patient and family. After consideration of risks, benefits and other options for treatment, the patient has consented to  Procedure(s): L3-4 laminectomy & discectomy (N/A) as a surgical intervention.  The patient's history has been reviewed, patient examined, no change in status, stable for surgery.  I have reviewed the patient's chart and labs.  Questions were answered to the patient's satisfaction.     Deetta Perla

## 2019-06-22 NOTE — Anesthesia Post-op Follow-up Note (Signed)
Anesthesia QCDR form completed.        

## 2019-06-22 NOTE — Transfer of Care (Signed)
Immediate Anesthesia Transfer of Care Note  Patient: Eduardo MAYALL Sr.  Procedure(s) Performed: L3-4 laminectomy & discectomy (N/A )  Patient Location: PACU  Anesthesia Type:General  Level of Consciousness: sedated and responds to stimulation  Airway & Oxygen Therapy: Patient Spontanous Breathing and Patient connected to face mask oxygen  Post-op Assessment: Report given to RN and Post -op Vital signs reviewed and stable  Post vital signs: Reviewed and stable  Last Vitals:  Vitals Value Taken Time  BP 105/63 06/22/19 1040  Temp 36.8 C 06/22/19 1040  Pulse 96 06/22/19 1045  Resp 14 06/22/19 1045  SpO2 96 % 06/22/19 1045  Vitals shown include unvalidated device data.  Last Pain:  Vitals:   06/22/19 0627  TempSrc: Tympanic  PainSc: 0-No pain         Complications: No apparent anesthesia complications

## 2019-06-22 NOTE — Progress Notes (Signed)
Procedure: L3-4 lumbar laminectomy and microdiscectomy Procedure date: 06/22/2019 Diagnosis: Lumbar radiculopathy  History: Eduardo PELISSIER Sr. is s/p L3-4 lumbar laminectomy and microdiscectomy for lumbar radiculopathy.  POD0: Tolerated procedure well. Evaluated recovery still disoriented from anesthesia.  No complaints at this time  Physical Exam: Vitals:   06/22/19 0627 06/22/19 1040  BP: (!) 139/104 105/63  Pulse: 92 92  Resp: 18 13  Temp: 97.6 F (36.4 C) 98.3 F (36.8 C)  SpO2: 97% 95%   Strength:5/5 throughout lower extremities bilaterally Sensation: Unable to fully assess at this time Skin: Dressing intact at incision site.  Data:  No results for input(s): NA, K, CL, CO2, BUN, CREATININE, LABGLOM, GLUCOSE, CALCIUM in the last 168 hours. No results for input(s): AST, ALT, ALKPHOS in the last 168 hours.  Invalid input(s): TBILI   No results for input(s): WBC, HGB, HCT, PLT in the last 168 hours. No results for input(s): APTT, INR in the last 168 hours.       Other tests/results: Lumbar imaging pending  Assessment/Plan:  FENRIS CAUBLE Sr. is POD 0 status post L3-4 lumbar laminectomy and microdiscectomy for lumbar radiculopathy.  We will continue to monitor for adequate pain control and to receive brace.  - mobilize - pain control - DVT prophylaxis - Brace  Marin Olp PA-C Department of Neurosurgery

## 2019-06-22 NOTE — Evaluation (Addendum)
Physical Therapy Evaluation Patient Details Name: Eduardo DEWALD Sr. MRN: [id number] DOB: [birth year] Today's Date: 06/22/2019   History of Present Illness  Pt admitted for L3/L4 laminectomy and discectomy s/p R leg pain x 1 year. History includes GERD and HTN. Pt doesn't have lumbar brace in room, limited OOB mobility this date. Consulted to evaluate on day 0 for clearance to perform standing imaging. Cleared to work with by neurosx.  Clinical Impression  Pt is a pleasant 68 year old male who was admitted for L3/L4 laminectomy and discectomy and is POD 0 at time of evaluation. Pt performs bed mobility, transfers, and ambulation with cga and RW. Educated on back precautions and safety/sequencing of RW. Pt demonstrates deficits with mobility/pain/balance. Reports increased pain after mobility. Would benefit from skilled PT to address above deficits and promote optimal return to PLOF. Will plan to resume PT tomorrow after brace donned.    Follow Up Recommendations Follow surgeon's recommendation for DC plan and follow-up therapies    Equipment Recommendations  Rolling walker with 5" wheels    Recommendations for Other Services       Precautions / Restrictions Precautions Precautions: Fall Restrictions Weight Bearing Restrictions: No      Mobility  Bed Mobility Overal bed mobility: Needs Assistance Bed Mobility: Supine to Sit     Supine to sit: Min guard     General bed mobility comments: safe technique with cues for technique. Once seated at EOB, slight dizzines, however improved with time.  Transfers Overall transfer level: Needs assistance Equipment used: Rolling walker (2 wheeled) Transfers: Sit to/from Stand Sit to Stand: Min guard         General transfer comment: safe technique with cues to push from seated surface. Increased back pain noted with OOB mobility  Ambulation/Gait Ambulation/Gait assistance: Min guard Gait Distance (Feet): 40 Feet Assistive device:  Rolling walker (2 wheeled) Gait Pattern/deviations: Step-to pattern     General Gait Details: slow gait speed with slight sway in balance during mobility. Tends to veer R, easily corrected with cues. No brace available at this time. Deferred further ambulation  Stairs            Wheelchair Mobility    Modified Rankin (Stroke Patients Only)       Balance Overall balance assessment: Needs assistance Sitting-balance support: Feet supported Sitting balance-Leahy Scale: Good     Standing balance support: Bilateral upper extremity supported Standing balance-Leahy Scale: Good                               Pertinent Vitals/Pain Pain Assessment: 0-10 Pain Score: 7  Pain Location: surigical incision in low back Pain Descriptors / Indicators: Operative site guarding Pain Intervention(s): Limited activity within patient's tolerance;Premedicated before session    Home Living Family/patient expects to be discharged to:: Private residence Living Arrangements: Alone Available Help at Discharge: Family(son will stay with him after sx, will also have daughter) Type of Home: House Home Access: Stairs to enter Entrance Stairs-Rails: Right Entrance Stairs-Number of Steps: 4 Home Layout: One level Home Equipment: None(may have access to RW?)      Prior Function Level of Independence: Independent         Comments: indep with all mobility, driving, working full time.     Hand Dominance        Extremity/Trunk Assessment   Upper Extremity Assessment Upper Extremity Assessment: Overall WFL for tasks assessed    Lower  Extremity Assessment Lower Extremity Assessment: Overall WFL for tasks assessed       Communication   Communication: No difficulties  Cognition Arousal/Alertness: Awake/alert Behavior During Therapy: WFL for tasks assessed/performed Overall Cognitive Status: Within Functional Limits for tasks assessed                                         General Comments      Exercises     Assessment/Plan    PT Assessment Patient needs continued PT services  PT Problem List Decreased mobility;Decreased knowledge of precautions;Pain;Decreased knowledge of use of DME       PT Treatment Interventions Gait training;Therapeutic exercise;DME instruction;Stair training    PT Goals (Current goals can be found in the Care Plan section)  Acute Rehab PT Goals Patient Stated Goal: to go home PT Goal Formulation: With patient Time For Goal Achievement: 07/06/19 Potential to Achieve Goals: Good Additional Goals Additional Goal #1: Pt will be able to perform bed mobility/transfers with mod I and LRAD in order to improve functional independence    Frequency 7X/week   Barriers to discharge        Co-evaluation               AM-PAC PT "6 Clicks" Mobility  Outcome Measure Help needed turning from your back to your side while in a flat bed without using bedrails?: A Little Help needed moving from lying on your back to sitting on the side of a flat bed without using bedrails?: A Little Help needed moving to and from a bed to a chair (including a wheelchair)?: A Little Help needed standing up from a chair using your arms (e.g., wheelchair or bedside chair)?: A Little Help needed to walk in hospital room?: A Little Help needed climbing 3-5 steps with a railing? : A Lot 6 Click Score: 17    End of Session Equipment Utilized During Treatment: Gait belt Activity Tolerance: Patient tolerated treatment well Patient left: in bed;with bed alarm set Nurse Communication: Mobility status PT Visit Diagnosis: Difficulty in walking, not elsewhere classified (R26.2);Pain Pain - Right/Left: Right Pain - part of body: (back)    Time: 1275-1700 PT Time Calculation (min) (ACUTE ONLY): 20 min   Charges:   PT Evaluation $PT Eval Low Complexity: 1 Low PT Treatments $Gait Training: 8-22 mins        Greggory Stallion, PT,  DPT [phone]   Eduardo Chapman 06/22/2019, 4:42 PM

## 2019-06-22 NOTE — Anesthesia Preprocedure Evaluation (Signed)
Anesthesia Evaluation  Patient identified by MRN, date of birth, ID band Patient awake    Reviewed: Allergy & Precautions, H&P , NPO status , Patient's Chart, lab work & pertinent test results  History of Anesthesia Complications (+) AWARENESS UNDER ANESTHESIA and history of anesthetic complications  Airway Mallampati: III  TM Distance: <3 FB Neck ROM: limited    Dental  (+) Chipped   Pulmonary neg shortness of breath, former smoker,  Signs and symptoms suggestive of sleep apnea            Cardiovascular Exercise Tolerance: Good (-) angina(-) Past MI and (-) DOE negative cardio ROS       Neuro/Psych negative neurological ROS  negative psych ROS   GI/Hepatic negative GI ROS, Neg liver ROS, neg GERD  ,  Endo/Other  negative endocrine ROS  Renal/GU      Musculoskeletal   Abdominal   Peds  Hematology negative hematology ROS (+)   Anesthesia Other Findings Past Medical History: 1985: Cancer ([place])     Comment:  skin cancer of face, removed No date: Hyperlipidemia 2012: Personal history of colonic polyps  Past Surgical History: 2002,2012: COLONOSCOPY     Comment:  path report 2012, benign 06/12/2017: COLONOSCOPY WITH PROPOFOL; N/A     Comment:  Procedure: COLONOSCOPY WITH PROPOFOL;  Surgeon: Christene Lye, MD;  Location: [facility] ENDOSCOPY;  Service:               Endoscopy;  Laterality: N/A; No date: TONSILLECTOMY  BMI    Body Mass Index: 36.17 kg/m      Reproductive/Obstetrics negative OB ROS                             Anesthesia Physical Anesthesia Plan  ASA: III  Anesthesia Plan: General ETT   Post-op Pain Management:    Induction: Intravenous  PONV Risk Score and Plan: Ondansetron, Dexamethasone, Midazolam and Treatment may vary due to age or medical condition  Airway Management Planned: Oral ETT and Video Laryngoscope Planned  Additional  Equipment:   Intra-op Plan:   Post-operative Plan: Extubation in OR  Informed Consent: I have reviewed the patients History and Physical, chart, labs and discussed the procedure including the risks, benefits and alternatives for the proposed anesthesia with the patient or authorized representative who has indicated his/her understanding and acceptance.     Dental Advisory Given  Plan Discussed with: Anesthesiologist, CRNA and Surgeon  Anesthesia Plan Comments: (Patient consented for risks of anesthesia including but not limited to:  - adverse reactions to medications - damage to teeth, lips or other oral mucosa - sore throat or hoarseness - Damage to heart, brain, lungs or loss of life  Patient voiced understanding.)        Anesthesia Quick Evaluation

## 2019-06-23 DIAGNOSIS — M48061 Spinal stenosis, lumbar region without neurogenic claudication: Secondary | ICD-10-CM | POA: Diagnosis not present

## 2019-06-23 MED ORDER — METHOCARBAMOL 500 MG PO TABS: 500.0000 mg | ORAL_TABLET | Freq: Four times a day (QID) | ORAL | 0 refills | Status: AC | PRN

## 2019-06-23 MED ORDER — OXYCODONE HCL 5 MG PO TABS: 5.0000 mg | ORAL_TABLET | ORAL | 0 refills | Status: AC | PRN

## 2019-06-23 NOTE — Progress Notes (Signed)
Discharge summary reviewed with verbal understanding. Lumbar precaution education given, brace perimeters also given. Awaiting transportation to home

## 2019-06-23 NOTE — Discharge Summary (Signed)
Procedure: L3-4 lumbar laminectomy and microdiscectomy Procedure date: 06/22/2019 Diagnosis: Lumbar radiculopathy  History: Eduardo CLARDY Sr. is s/p L3-4 lumbar laminectomy and microdiscectomy for lumbar radiculopathy.  POD1: Recovering well. Lower extremity pain that he was having prior to surgery has improved. No new numbness/tingling/pain. Complains of back pain 4/10. Xrays completed. Voiding, eating, and ambulating without issue. Brace received.    POD0: Tolerated procedure well. Evaluated recovery still disoriented from anesthesia.  No complaints at this time  Physical Exam: Vitals:   06/23/19 0430 06/23/19 0737  BP: 133/79 128/85  Pulse: 82 89  Resp: 18 20  Temp: 98 F (36.7 C) 97.8 F (36.6 C)  SpO2: 99% 98%   Strength:5/5 throughout lower extremities bilaterally Sensation: intact and symmetric throughout lower extremities bilaterally.  Skin: Glue intact at incision site.   Data:  No results for input(s): NA, K, CL, CO2, BUN, CREATININE, LABGLOM, GLUCOSE, CALCIUM in the last 168 hours. No results for input(s): AST, ALT, ALKPHOS in the last 168 hours.  Invalid input(s): TBILI   No results for input(s): WBC, HGB, HCT, PLT in the last 168 hours. No results for input(s): APTT, INR in the last 168 hours.       Other tests/results:  EXAM: LUMBAR SPINE - 2-3 VIEW 06/23/2019  COMPARISON:  Lumbar MRI dated 02/06/2019  FINDINGS: Lumbar alignment is normal. Disc spaces are preserved. No acute abnormalities. Aortic atherosclerosis.  IMPRESSION: 1. No significant abnormality of the lumbar spine. No visible postoperative changes. 2. Aortic atherosclerosis.  Assessment/Plan:  Eduardo Green Sr. is POD 1 status post L3-4 lumbar laminectomy and microdiscectomy for lumbar radiculopathy.  Will continue post op pain control with tylenol, muscle relaxer, and pain medication as needed. He is scheduled to follow up in clinic in approximately 2 weeks to monitor progress.  Advised to contact office if any questions or concerns arise before then.    Marin Olp PA-C Department of Neurosurgery

## 2020-08-09 IMAGING — CR LUMBAR SPINE - 2-3 VIEW
2 series · 2 of 2 positions shown · non-contrast
Comparison: Lumbar MRI dated 02/06/2019

CLINICAL DATA: Lumbar disc protrusion at L3-4. Status post surgery.

EXAM:
LUMBAR SPINE - 2-3 VIEW

[l-spine ap]
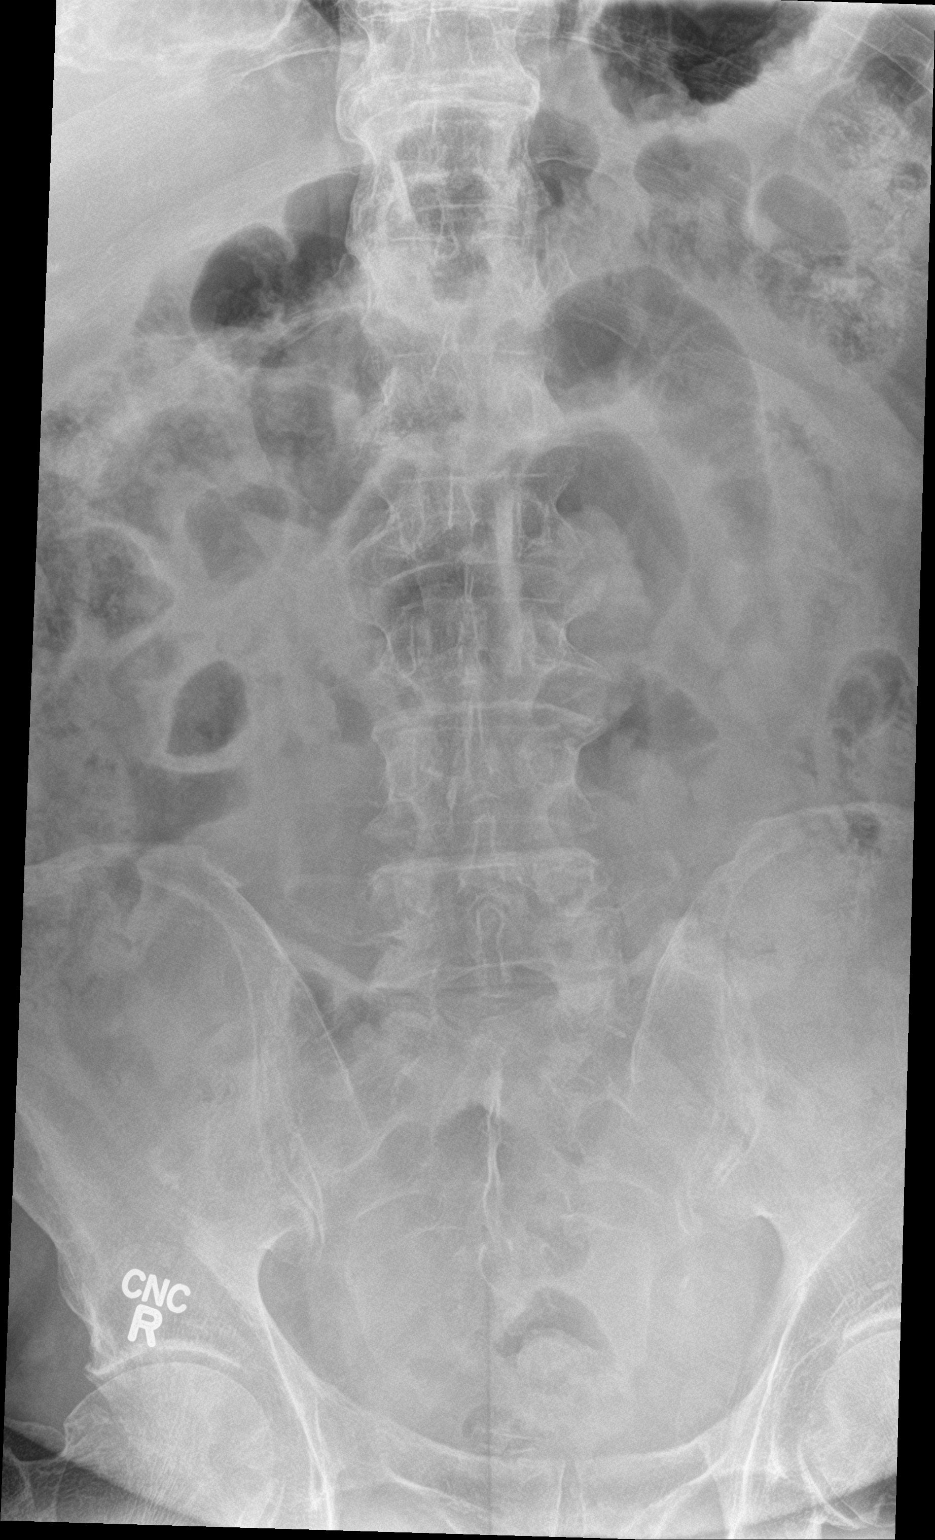

[l-spine lat]
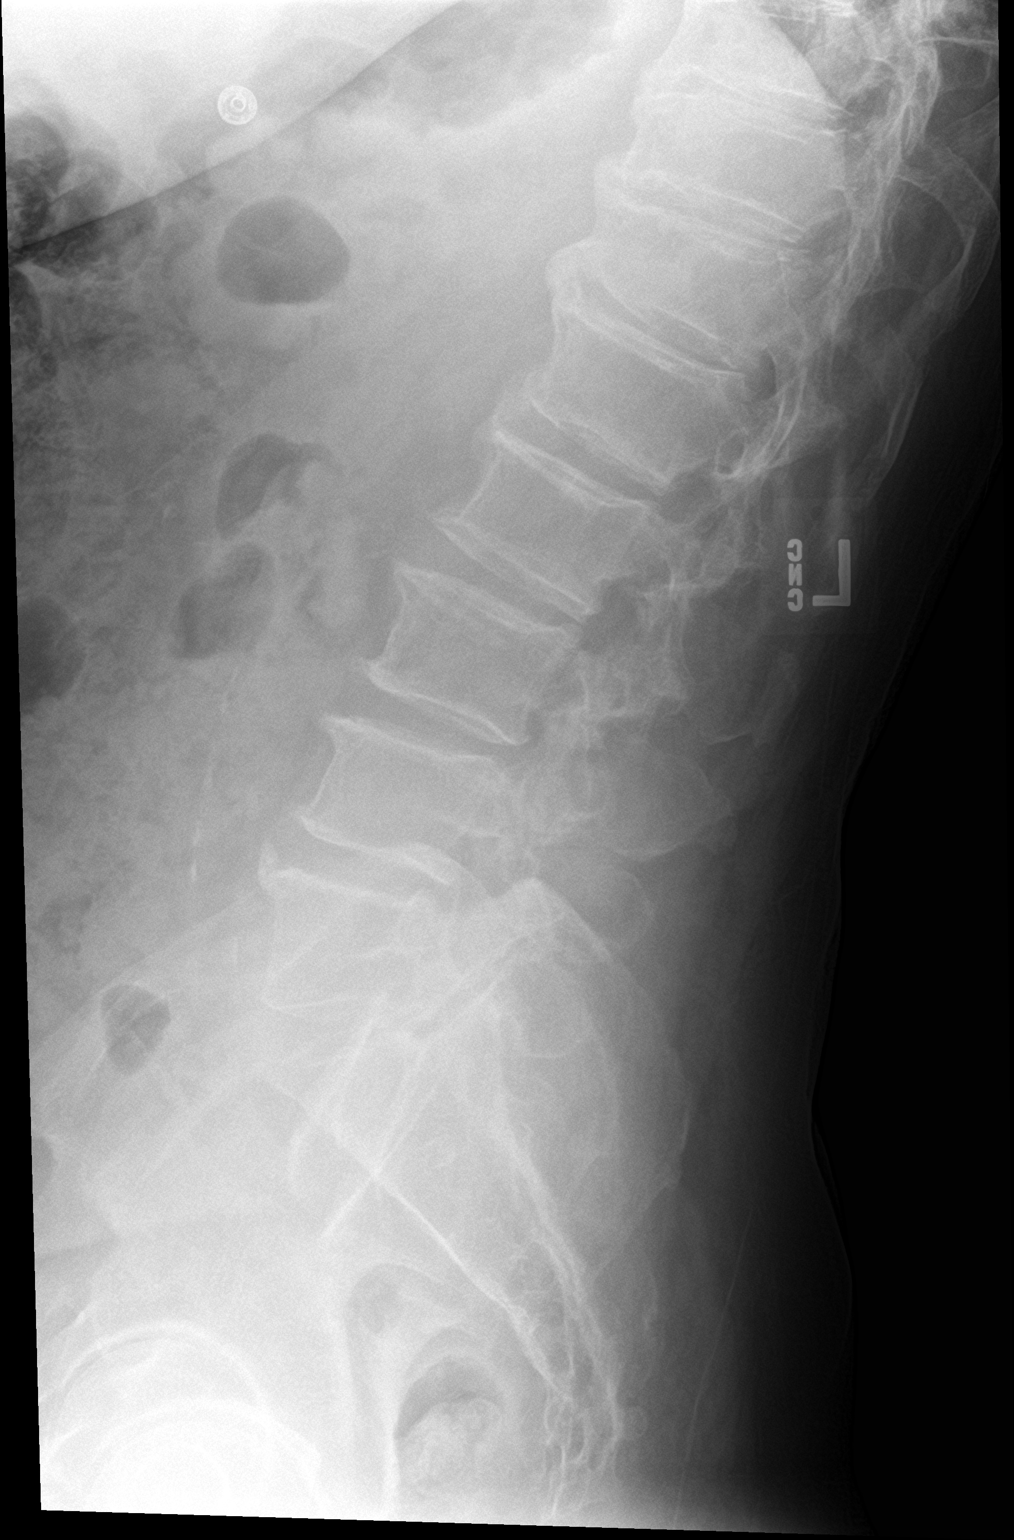

[2 of 2 positions shown; findings below may reference images not displayed]

FINDINGS: Lumbar alignment is normal. Disc spaces are preserved. No acute
abnormalities. Aortic atherosclerosis.
IMPRESSION: 1. No significant abnormality of the lumbar spine. No visible
postoperative changes.
2. Aortic atherosclerosis.

## 2020-08-09 IMAGING — RF DG C-ARM 61-120 MIN
1 series · 3 of 3 positions shown · non-contrast
Comparison: None.

CLINICAL DATA: Spinal surgery

EXAM:
DG C-ARM 61-120 MIN; LUMBAR SPINE - 2-3 VIEW
FLUOROSCOPY TIME:  15 seconds (13.8 mGy)

[Series 1: unknown protocol · 0.14mm/px · 3 of 3 slices shown]
[im 1/3]
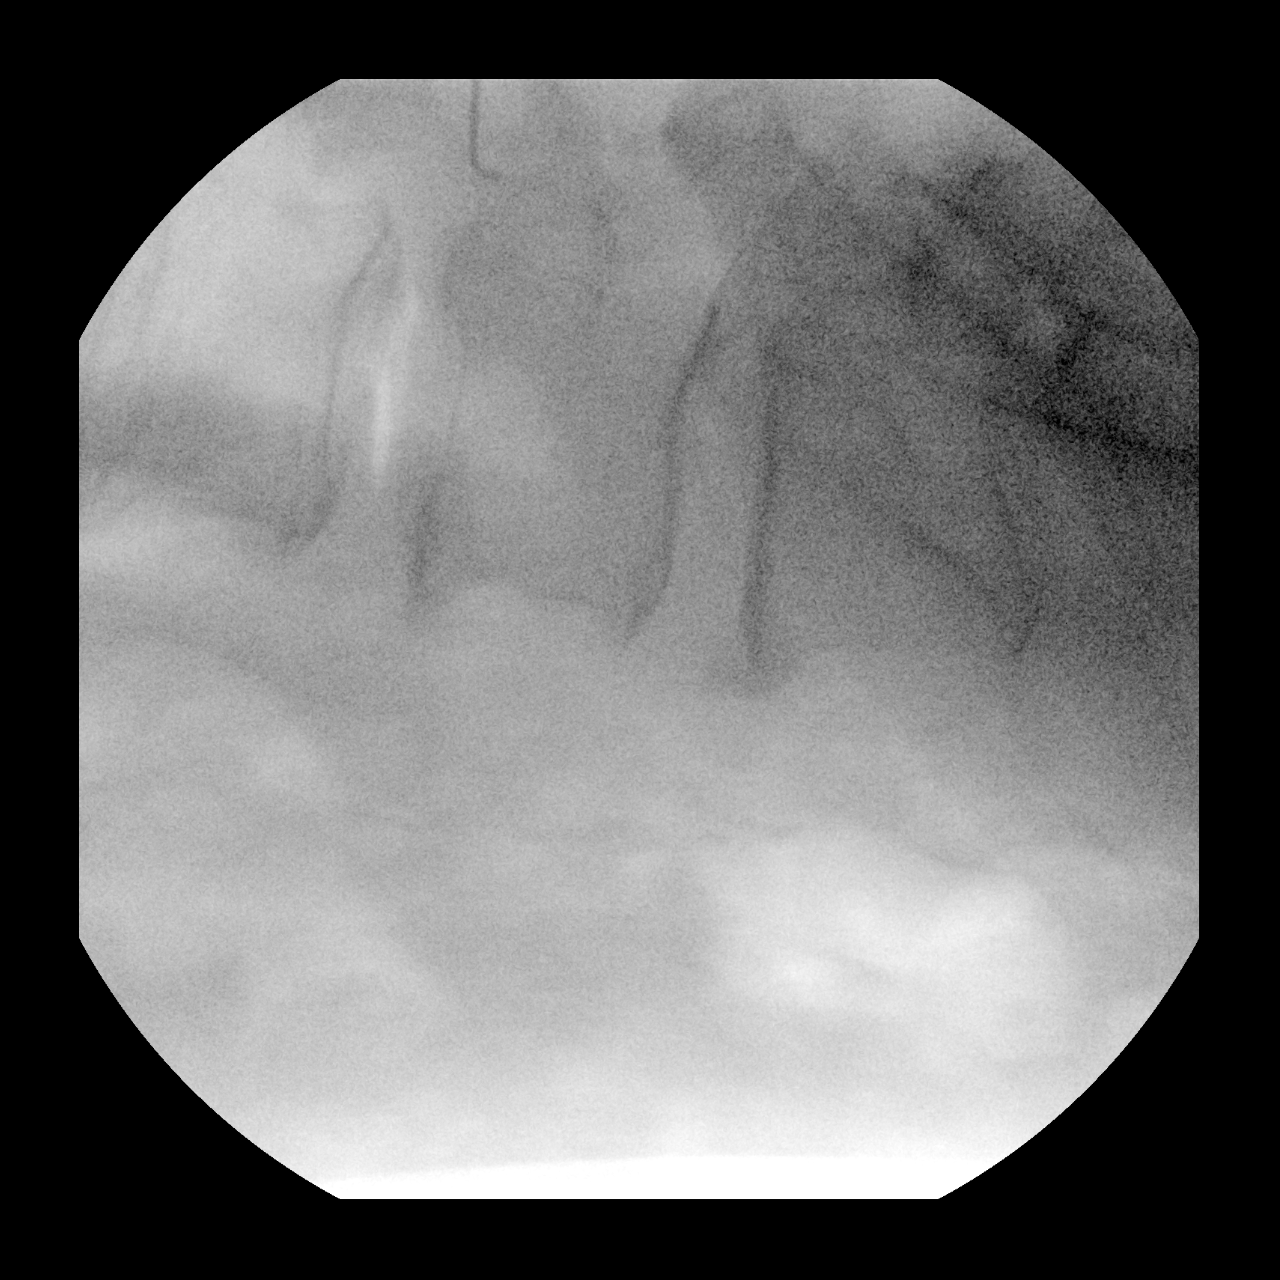
[im 2/3]
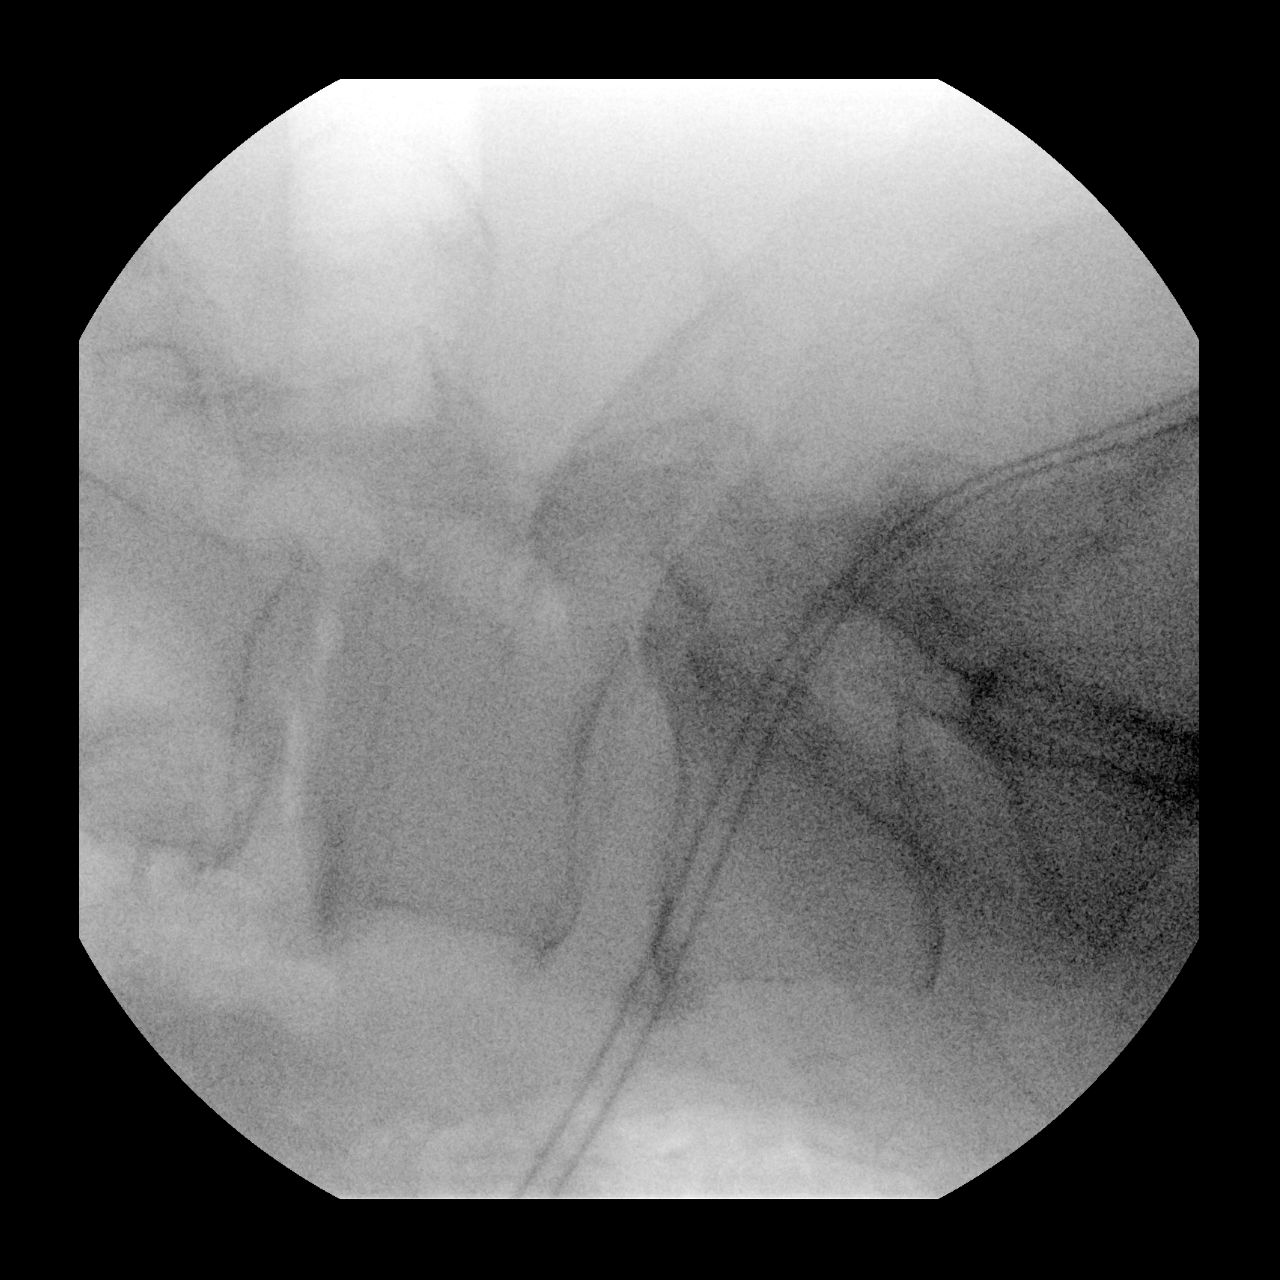
[im 3/3]
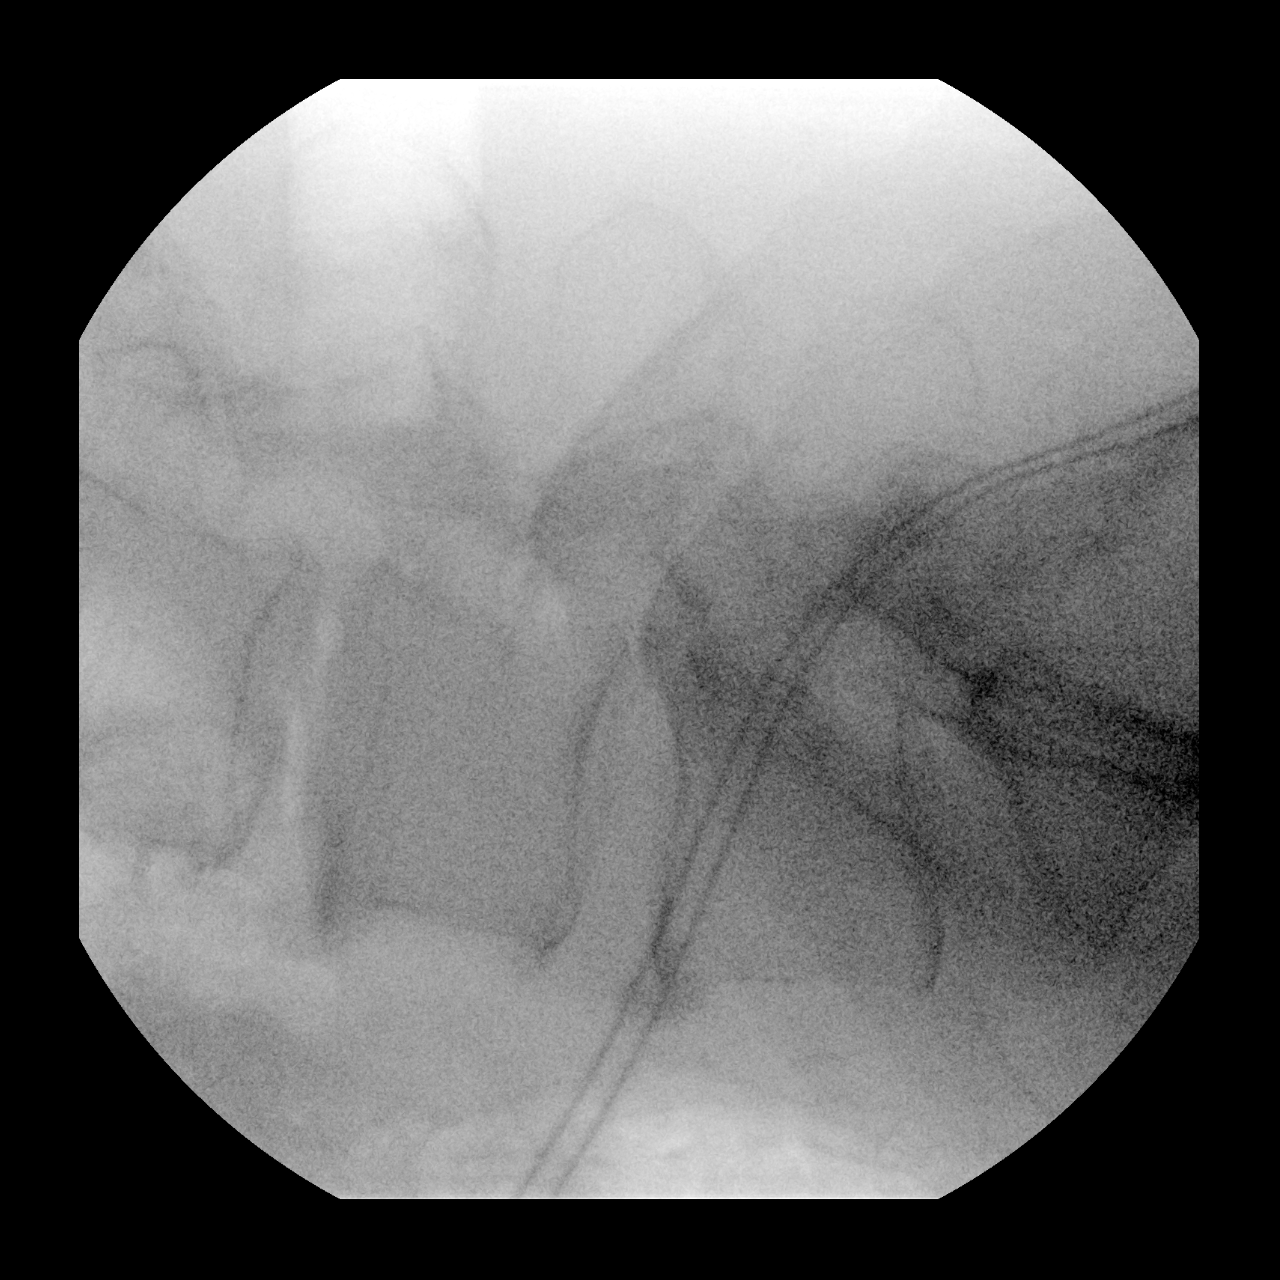

[3 of 3 positions shown; findings below may reference images not displayed]

FINDINGS: Three spot lateral projection intraoperative fluoroscopic images of
the lower lumbar spine are provided for review.

Lumbar spinal labeling is based on the presumption of 5 non
rib-bearing lumbar type vertebral bodies.

Initial image demonstrates an apparent marking instrument overlying
the soft tissues posterior to the presumed L4 vertebral body however
the initial image is degraded secondary to suboptimal field of view.

Subsequent images demonstrate lateral projection images of the lower
lumbar spine.

Linear radiopaque structure overlying the lower lumbar spine is
likely external to the patient.
IMPRESSION: Intraoperative radiographs of the lower lumbar spine as above.

## 2024-09-24 ENCOUNTER — Other Ambulatory Visit: Payer: Self-pay | Admitting: Internal Medicine

## 2024-09-24 DIAGNOSIS — Z136 Encounter for screening for cardiovascular disorders: Secondary | ICD-10-CM

## 2024-09-24 DIAGNOSIS — F17211 Nicotine dependence, cigarettes, in remission: Secondary | ICD-10-CM

## 2024-10-06 ENCOUNTER — Ambulatory Visit
Admission: RE | Admit: 2024-10-06 | Discharge: 2024-10-06 | Disposition: A | Discharge status: Home / Self Care | Source: Ambulatory Visit | Attending: Internal Medicine | Admitting: Internal Medicine

## 2024-10-06 DIAGNOSIS — Z136 Encounter for screening for cardiovascular disorders: Secondary | ICD-10-CM | POA: Insufficient documentation

## 2024-10-06 DIAGNOSIS — F17211 Nicotine dependence, cigarettes, in remission: Secondary | ICD-10-CM | POA: Diagnosis present
